# Patient Record
Sex: Male | Born: 1995 | Race: Black or African American | Hispanic: No | Marital: Single | Smoking: Never smoker
Health system: Southern US, Community
[De-identification: ages and names within clinical notes are randomized; demographics above are authoritative.]

## PROBLEM LIST (undated history)

## (undated) DIAGNOSIS — L732 Hidradenitis suppurativa: Secondary | ICD-10-CM

## (undated) HISTORY — PX: HYDRADENITIS EXCISION: SHX5243

---
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
## 2014-07-04 ENCOUNTER — Other Ambulatory Visit: Payer: Self-pay | Admitting: Otolaryngology

## 2014-07-04 DIAGNOSIS — D440 Neoplasm of uncertain behavior of thyroid gland: Secondary | ICD-10-CM

## 2014-07-04 DIAGNOSIS — E041 Nontoxic single thyroid nodule: Secondary | ICD-10-CM

## 2014-07-05 ENCOUNTER — Other Ambulatory Visit (HOSPITAL_COMMUNITY): Payer: Self-pay

## 2014-07-08 ENCOUNTER — Inpatient Hospital Stay (HOSPITAL_COMMUNITY): Admission: RE | Admit: 2014-07-08 | Payer: Self-pay | Source: Ambulatory Visit

## 2014-07-17 ENCOUNTER — Inpatient Hospital Stay: Admission: RE | Admit: 2014-07-17 | Payer: Self-pay | Source: Ambulatory Visit

## 2014-07-19 ENCOUNTER — Other Ambulatory Visit (HOSPITAL_COMMUNITY): Payer: Self-pay | Admitting: *Deleted

## 2014-07-22 ENCOUNTER — Encounter (HOSPITAL_COMMUNITY)
Admission: RE | Admit: 2014-07-22 | Discharge: 2014-07-22 | Disposition: A | Discharge status: Home / Self Care | Payer: BLUE CROSS/BLUE SHIELD | Source: Ambulatory Visit | Attending: Dermatology | Admitting: Dermatology

## 2014-07-22 DIAGNOSIS — L732 Hidradenitis suppurativa: Secondary | ICD-10-CM | POA: Diagnosis not present

## 2014-07-22 MED ORDER — SODIUM CHLORIDE 0.9 % IV SOLN
INTRAVENOUS | Status: DC
  Administered 2014-07-22: 250 mL via INTRAVENOUS

## 2014-07-22 MED ORDER — SODIUM CHLORIDE 0.9 % IV SOLN
5.0000 mg/kg | INTRAVENOUS | Status: DC
  Administered 2014-07-22: 300 mg via INTRAVENOUS
  Filled 2014-07-22: qty 30

## 2014-07-22 MED ORDER — DIPHENHYDRAMINE HCL 25 MG PO CAPS
50.0000 mg | ORAL_CAPSULE | ORAL | Status: DC
  Filled 2014-07-22: qty 2

## 2014-07-22 MED ORDER — ACETAMINOPHEN 325 MG PO TABS: 650.0000 mg | ORAL_TABLET | ORAL | Status: DC

## 2014-08-15 ENCOUNTER — Ambulatory Visit
Admission: RE | Admit: 2014-08-15 | Discharge: 2014-08-15 | Disposition: A | Discharge status: Home / Self Care | Payer: BLUE CROSS/BLUE SHIELD | Source: Ambulatory Visit | Attending: Otolaryngology | Admitting: Otolaryngology

## 2014-08-15 DIAGNOSIS — D440 Neoplasm of uncertain behavior of thyroid gland: Secondary | ICD-10-CM

## 2014-08-19 ENCOUNTER — Encounter (HOSPITAL_COMMUNITY)
Admission: RE | Admit: 2014-08-19 | Discharge: 2014-08-19 | Disposition: A | Discharge status: Home / Self Care | Payer: BLUE CROSS/BLUE SHIELD | Source: Ambulatory Visit | Attending: Dermatology | Admitting: Dermatology

## 2014-08-19 DIAGNOSIS — L732 Hidradenitis suppurativa: Secondary | ICD-10-CM | POA: Insufficient documentation

## 2014-08-19 MED ORDER — DIPHENHYDRAMINE HCL 25 MG PO CAPS: 50.0000 mg | ORAL_CAPSULE | ORAL | Status: DC

## 2014-08-19 MED ORDER — SODIUM CHLORIDE 0.9 % IV SOLN
5.0000 mg/kg | INTRAVENOUS | Status: DC
  Administered 2014-08-19: 300 mg via INTRAVENOUS
  Filled 2014-08-19: qty 30

## 2014-08-19 MED ORDER — SODIUM CHLORIDE 0.9 % IV SOLN
INTRAVENOUS | Status: DC
  Administered 2014-08-19: 10:00:00 via INTRAVENOUS

## 2014-08-19 MED ORDER — ACETAMINOPHEN 325 MG PO TABS: 650.0000 mg | ORAL_TABLET | ORAL | Status: DC

## 2014-08-20 ENCOUNTER — Ambulatory Visit
Admission: RE | Admit: 2014-08-20 | Discharge: 2014-08-20 | Disposition: A | Discharge status: Home / Self Care | Payer: BLUE CROSS/BLUE SHIELD | Source: Ambulatory Visit | Attending: Otolaryngology | Admitting: Otolaryngology

## 2014-08-20 ENCOUNTER — Other Ambulatory Visit: Payer: Self-pay | Admitting: Otolaryngology

## 2014-08-20 ENCOUNTER — Other Ambulatory Visit: Payer: BLUE CROSS/BLUE SHIELD

## 2014-08-20 ENCOUNTER — Other Ambulatory Visit (HOSPITAL_COMMUNITY)
Admission: RE | Admit: 2014-08-20 | Discharge: 2014-08-20 | Disposition: A | Discharge status: Home / Self Care | Payer: BLUE CROSS/BLUE SHIELD | Source: Ambulatory Visit | Attending: Interventional Radiology | Admitting: Interventional Radiology

## 2014-08-20 DIAGNOSIS — D440 Neoplasm of uncertain behavior of thyroid gland: Secondary | ICD-10-CM

## 2014-08-20 DIAGNOSIS — E041 Nontoxic single thyroid nodule: Secondary | ICD-10-CM | POA: Diagnosis not present

## 2014-09-16 ENCOUNTER — Ambulatory Visit (HOSPITAL_COMMUNITY)
Admission: RE | Admit: 2014-09-16 | Discharge: 2014-09-16 | Disposition: A | Discharge status: Home / Self Care | Payer: BLUE CROSS/BLUE SHIELD | Source: Ambulatory Visit | Attending: Dermatology | Admitting: Dermatology

## 2014-09-16 DIAGNOSIS — L732 Hidradenitis suppurativa: Secondary | ICD-10-CM | POA: Diagnosis not present

## 2014-09-16 MED ORDER — ACETAMINOPHEN 325 MG PO TABS: 650.0000 mg | ORAL_TABLET | Freq: Once | ORAL | Status: DC

## 2014-09-16 MED ORDER — SODIUM CHLORIDE 0.9 % IV SOLN
5.0000 mg/kg | Freq: Once | INTRAVENOUS | Status: AC
  Administered 2014-09-16: 300 mg via INTRAVENOUS
  Filled 2014-09-16: qty 30

## 2014-09-16 MED ORDER — DIPHENHYDRAMINE HCL 25 MG PO CAPS: 50.0000 mg | ORAL_CAPSULE | Freq: Once | ORAL | Status: DC

## 2014-09-16 MED ORDER — SODIUM CHLORIDE 0.9 % IV SOLN: Freq: Once | INTRAVENOUS | Status: DC

## 2014-10-14 ENCOUNTER — Encounter (HOSPITAL_COMMUNITY)
Admission: RE | Admit: 2014-10-14 | Discharge: 2014-10-14 | Disposition: A | Discharge status: Home / Self Care | Payer: BLUE CROSS/BLUE SHIELD | Source: Ambulatory Visit | Attending: Dermatology | Admitting: Dermatology

## 2014-10-14 DIAGNOSIS — L732 Hidradenitis suppurativa: Secondary | ICD-10-CM | POA: Insufficient documentation

## 2014-10-14 MED ORDER — DIPHENHYDRAMINE HCL 25 MG PO TABS
50.0000 mg | ORAL_TABLET | ORAL | Status: DC
  Filled 2014-10-14: qty 2

## 2014-10-14 MED ORDER — SODIUM CHLORIDE 0.9 % IV SOLN
INTRAVENOUS | Status: DC
  Administered 2014-10-14: 11:00:00 via INTRAVENOUS

## 2014-10-14 MED ORDER — ACETAMINOPHEN 325 MG PO TABS: 650.0000 mg | ORAL_TABLET | ORAL | Status: DC

## 2014-10-14 MED ORDER — SODIUM CHLORIDE 0.9 % IV SOLN
5.0000 mg/kg | INTRAVENOUS | Status: DC
  Administered 2014-10-14: 300 mg via INTRAVENOUS
  Filled 2014-10-14: qty 30

## 2014-11-12 ENCOUNTER — Ambulatory Visit (HOSPITAL_COMMUNITY)
Admission: RE | Admit: 2014-11-12 | Discharge: 2014-11-12 | Disposition: A | Discharge status: Home / Self Care | Payer: BLUE CROSS/BLUE SHIELD | Source: Ambulatory Visit | Attending: Dermatology | Admitting: Dermatology

## 2014-11-12 DIAGNOSIS — L732 Hidradenitis suppurativa: Secondary | ICD-10-CM | POA: Diagnosis not present

## 2014-11-12 MED ORDER — ACETAMINOPHEN 325 MG PO TABS: 650.0000 mg | ORAL_TABLET | ORAL | Status: DC

## 2014-11-12 MED ORDER — DIPHENHYDRAMINE HCL 25 MG PO CAPS: 50.0000 mg | ORAL_CAPSULE | ORAL | Status: DC

## 2014-11-12 MED ORDER — INFLIXIMAB 100 MG IV SOLR
5.0000 mg/kg | INTRAVENOUS | Status: DC
  Administered 2014-11-12: 300 mg via INTRAVENOUS
  Filled 2014-11-12: qty 30

## 2014-11-12 MED ORDER — SODIUM CHLORIDE 0.9 % IV SOLN
INTRAVENOUS | Status: DC
  Administered 2014-11-12: 10:00:00 via INTRAVENOUS

## 2014-12-09 ENCOUNTER — Ambulatory Visit (HOSPITAL_COMMUNITY)
Admission: RE | Admit: 2014-12-09 | Discharge: 2014-12-09 | Disposition: A | Discharge status: Home / Self Care | Payer: BLUE CROSS/BLUE SHIELD | Source: Ambulatory Visit | Attending: Dermatology | Admitting: Dermatology

## 2014-12-09 DIAGNOSIS — L732 Hidradenitis suppurativa: Secondary | ICD-10-CM | POA: Diagnosis not present

## 2014-12-09 MED ORDER — ACETAMINOPHEN 325 MG PO TABS: 650.0000 mg | ORAL_TABLET | ORAL | Status: DC

## 2014-12-09 MED ORDER — INFLIXIMAB 100 MG IV SOLR
5.0000 mg/kg | INTRAVENOUS | Status: DC
  Administered 2014-12-09: 300 mg via INTRAVENOUS
  Filled 2014-12-09: qty 30

## 2014-12-09 MED ORDER — DIPHENHYDRAMINE HCL 25 MG PO CAPS
50.0000 mg | ORAL_CAPSULE | ORAL | Status: DC
Start: 2014-12-10 — End: 2014-12-10

## 2014-12-09 MED ORDER — SODIUM CHLORIDE 0.9 % IV SOLN
INTRAVENOUS | Status: DC
Start: 2014-12-10 — End: 2014-12-10
  Administered 2014-12-09: 11:00:00 via INTRAVENOUS

## 2014-12-09 NOTE — Discharge Instructions (Signed)
Excuse from Work, [employer], or Physical Activity ______________[name]____________________________________ needs to be excused from: _____ Work __X___ [employer] _____ Physical activity Beginning now and through the following date: ___10/3/16_________________ _____ He/she may return to work or school but still avoid physical activity from now until: ____________________ __X___ He/she may return to full physical activity as of: 10/3/16____________________ Caregiver's signature: _____Kristin [place] [phone]___________________________________  Date: _____10/3/2016_________________________________________________ Document Released: 08/18/2000 Document Revised: 05/17/2011 Document Reviewed: 02/22/2005 ExitCare Patient Information 2015 [place]. This information is not intended to replace advice given to you by your health care provider. Make sure you discuss any questions you have with your health care provider.

## 2014-12-10 DIAGNOSIS — L732 Hidradenitis suppurativa: Secondary | ICD-10-CM | POA: Diagnosis not present

## 2014-12-10 MED FILL — Sodium Chloride IV Soln 0.9%: INTRAVENOUS | Qty: 250 | Status: AC

## 2015-01-07 ENCOUNTER — Ambulatory Visit (HOSPITAL_COMMUNITY)
Admission: RE | Admit: 2015-01-07 | Discharge: 2015-01-07 | Disposition: A | Discharge status: Home / Self Care | Payer: BLUE CROSS/BLUE SHIELD | Source: Ambulatory Visit | Attending: Dermatology | Admitting: Dermatology

## 2015-01-07 DIAGNOSIS — L732 Hidradenitis suppurativa: Secondary | ICD-10-CM | POA: Diagnosis not present

## 2015-01-07 MED ORDER — INFLIXIMAB 100 MG IV SOLR
5.0000 mg/kg | INTRAVENOUS | Status: DC
  Administered 2015-01-07: 300 mg via INTRAVENOUS
  Filled 2015-01-07: qty 30

## 2015-01-07 MED ORDER — ACETAMINOPHEN 325 MG PO TABS: 650.0000 mg | ORAL_TABLET | Freq: Four times a day (QID) | ORAL | Status: DC | PRN

## 2015-01-07 MED ORDER — DIPHENHYDRAMINE HCL 25 MG PO CAPS: 50.0000 mg | ORAL_CAPSULE | Freq: Once | ORAL | Status: DC

## 2015-01-07 MED ORDER — SODIUM CHLORIDE 0.9 % IV SOLN
INTRAVENOUS | Status: DC
  Administered 2015-01-07: 11:00:00 via INTRAVENOUS

## 2015-02-04 ENCOUNTER — Encounter (HOSPITAL_COMMUNITY)
Admission: RE | Admit: 2015-02-04 | Discharge: 2015-02-04 | Disposition: A | Discharge status: Home / Self Care | Payer: BLUE CROSS/BLUE SHIELD | Source: Ambulatory Visit | Attending: Dermatology | Admitting: Dermatology

## 2015-02-04 DIAGNOSIS — L732 Hidradenitis suppurativa: Secondary | ICD-10-CM | POA: Insufficient documentation

## 2015-02-04 MED ORDER — SODIUM CHLORIDE 0.9 % IV SOLN
5.0000 mg/kg | INTRAVENOUS | Status: DC
  Administered 2015-02-04: 300 mg via INTRAVENOUS
  Filled 2015-02-04: qty 30

## 2015-02-04 MED ORDER — DIPHENHYDRAMINE HCL 25 MG PO CAPS: 50.0000 mg | ORAL_CAPSULE | ORAL | Status: DC

## 2015-02-04 MED ORDER — SODIUM CHLORIDE 0.9 % IV SOLN
INTRAVENOUS | Status: DC
  Administered 2015-02-04: 11:00:00 via INTRAVENOUS

## 2015-02-04 MED ORDER — ACETAMINOPHEN 325 MG PO TABS: 650.0000 mg | ORAL_TABLET | ORAL | Status: DC

## 2015-03-04 ENCOUNTER — Encounter (HOSPITAL_COMMUNITY)
Admission: RE | Admit: 2015-03-04 | Discharge: 2015-03-04 | Disposition: A | Discharge status: Home / Self Care | Payer: BLUE CROSS/BLUE SHIELD | Source: Ambulatory Visit | Attending: Dermatology | Admitting: Dermatology

## 2015-03-04 DIAGNOSIS — L732 Hidradenitis suppurativa: Secondary | ICD-10-CM | POA: Diagnosis not present

## 2015-03-04 MED ORDER — SODIUM CHLORIDE 0.9 % IV SOLN
INTRAVENOUS | Status: DC
  Administered 2015-03-04: 11:00:00 via INTRAVENOUS

## 2015-03-04 MED ORDER — INFLIXIMAB 100 MG IV SOLR
5.0000 mg/kg | INTRAVENOUS | Status: DC
  Administered 2015-03-04: 300 mg via INTRAVENOUS
  Filled 2015-03-04: qty 30

## 2015-03-04 MED ORDER — DIPHENHYDRAMINE HCL 25 MG PO CAPS: 50.0000 mg | ORAL_CAPSULE | ORAL | Status: DC

## 2015-03-04 MED ORDER — ACETAMINOPHEN 325 MG PO TABS: 650.0000 mg | ORAL_TABLET | ORAL | Status: DC

## 2015-03-31 ENCOUNTER — Other Ambulatory Visit (HOSPITAL_COMMUNITY): Payer: Self-pay | Admitting: *Deleted

## 2015-04-01 ENCOUNTER — Encounter (HOSPITAL_COMMUNITY)
Admission: RE | Admit: 2015-04-01 | Discharge: 2015-04-01 | Disposition: A | Discharge status: Home / Self Care | Payer: BLUE CROSS/BLUE SHIELD | Source: Ambulatory Visit | Attending: Dermatology | Admitting: Dermatology

## 2015-04-01 DIAGNOSIS — L732 Hidradenitis suppurativa: Secondary | ICD-10-CM | POA: Insufficient documentation

## 2015-04-01 MED ORDER — ACETAMINOPHEN 325 MG PO TABS: 650.0000 mg | ORAL_TABLET | ORAL | Status: DC

## 2015-04-01 MED ORDER — SODIUM CHLORIDE 0.9 % IV SOLN
INTRAVENOUS | Status: DC
  Administered 2015-04-01: 10:00:00 via INTRAVENOUS

## 2015-04-01 MED ORDER — DIPHENHYDRAMINE HCL 25 MG PO CAPS: 50.0000 mg | ORAL_CAPSULE | ORAL | Status: DC

## 2015-04-01 MED ORDER — SODIUM CHLORIDE 0.9 % IV SOLN
5.0000 mg/kg | INTRAVENOUS | Status: DC
  Administered 2015-04-01: 300 mg via INTRAVENOUS
  Filled 2015-04-01: qty 30

## 2015-04-01 NOTE — Discharge Instructions (Signed)
°  Excuse from Work, [employer], or Physical Activity _[name]_________________________________________________ needs to be excused from: _____ Work __X___ [employer] _____ Physical activity beginning now and through the following date: 1/24/2017____________________. _X____ He or she may return to work or school but should still avoid the following physical activity or activities from now until ___N/A_________________. Activity restrictions include: _____ Lifting more than _______ lb _____ Sitting longer than __________ minutes at a time _____ Standing longer than ________ minutes at a time _____ He or she may return to full physical activity as of ___1/24/2017_________________. Health Care Provider Name (printed): Dorothea Glassman RN ________________________________________  [facility] Provider (signature): ___________________________________________ Date: __1/24/2017______________   This information is not intended to replace advice given to you by your health care provider. Make sure you discuss any questions you have with your health care provider.   Document Released: 08/18/2000 Document Revised: 03/15/2014 Document Reviewed: 09/24/2013 Elsevier Interactive Patient Education [employer].

## 2015-04-29 ENCOUNTER — Ambulatory Visit (HOSPITAL_COMMUNITY): Payer: BLUE CROSS/BLUE SHIELD

## 2015-05-05 ENCOUNTER — Other Ambulatory Visit (HOSPITAL_COMMUNITY): Payer: Self-pay | Admitting: *Deleted

## 2015-05-06 ENCOUNTER — Ambulatory Visit (HOSPITAL_COMMUNITY)
Admission: RE | Admit: 2015-05-06 | Discharge: 2015-05-06 | Disposition: A | Discharge status: Home / Self Care | Payer: Self-pay | Source: Ambulatory Visit | Attending: Dermatology | Admitting: Dermatology

## 2015-05-06 DIAGNOSIS — L732 Hidradenitis suppurativa: Secondary | ICD-10-CM | POA: Insufficient documentation

## 2015-05-06 MED ORDER — SODIUM CHLORIDE 0.9 % IV SOLN
INTRAVENOUS | Status: AC
  Administered 2015-05-06: 11:00:00 via INTRAVENOUS

## 2015-05-06 MED ORDER — ACETAMINOPHEN 325 MG PO TABS
650.0000 mg | ORAL_TABLET | ORAL | Status: DC
Start: 2015-05-06 — End: 2015-05-07

## 2015-05-06 MED ORDER — DIPHENHYDRAMINE HCL 25 MG PO CAPS: 50.0000 mg | ORAL_CAPSULE | ORAL | Status: DC

## 2015-05-06 MED ORDER — SODIUM CHLORIDE 0.9 % IV SOLN
5.0000 mg/kg | INTRAVENOUS | Status: AC
  Administered 2015-05-06: 300 mg via INTRAVENOUS
  Filled 2015-05-06: qty 30

## 2015-06-03 ENCOUNTER — Ambulatory Visit (HOSPITAL_COMMUNITY)
Admission: RE | Admit: 2015-06-03 | Discharge: 2015-06-03 | Disposition: A | Discharge status: Home / Self Care | Payer: Self-pay | Source: Ambulatory Visit | Attending: Dermatology | Admitting: Dermatology

## 2015-06-03 DIAGNOSIS — L732 Hidradenitis suppurativa: Secondary | ICD-10-CM | POA: Insufficient documentation

## 2015-06-03 DIAGNOSIS — Z79899 Other long term (current) drug therapy: Secondary | ICD-10-CM | POA: Insufficient documentation

## 2015-06-03 MED ORDER — DIPHENHYDRAMINE HCL 25 MG PO CAPS: 50.0000 mg | ORAL_CAPSULE | ORAL | Status: DC

## 2015-06-03 MED ORDER — SODIUM CHLORIDE 0.9 % IV SOLN
5.0000 mg/kg | INTRAVENOUS | Status: DC
  Administered 2015-06-03: 300 mg via INTRAVENOUS
  Filled 2015-06-03: qty 30

## 2015-06-03 MED ORDER — SODIUM CHLORIDE 0.9 % IV SOLN
INTRAVENOUS | Status: DC
  Administered 2015-06-03: 13:00:00 via INTRAVENOUS

## 2015-06-03 MED ORDER — ACETAMINOPHEN 325 MG PO TABS: 650.0000 mg | ORAL_TABLET | ORAL | Status: DC

## 2015-07-01 ENCOUNTER — Inpatient Hospital Stay (HOSPITAL_COMMUNITY): Admission: RE | Admit: 2015-07-01 | Payer: BLUE CROSS/BLUE SHIELD | Source: Ambulatory Visit

## 2015-07-01 ENCOUNTER — Other Ambulatory Visit (HOSPITAL_COMMUNITY): Payer: Self-pay | Admitting: *Deleted

## 2015-07-02 ENCOUNTER — Encounter (HOSPITAL_COMMUNITY)
Admission: RE | Admit: 2015-07-02 | Discharge: 2015-07-02 | Disposition: A | Discharge status: Home / Self Care | Payer: BLUE CROSS/BLUE SHIELD | Source: Ambulatory Visit | Attending: Dermatology | Admitting: Dermatology

## 2015-07-02 DIAGNOSIS — L732 Hidradenitis suppurativa: Secondary | ICD-10-CM | POA: Insufficient documentation

## 2015-07-02 MED ORDER — SODIUM CHLORIDE 0.9 % IV SOLN
INTRAVENOUS | Status: DC
  Administered 2015-07-02: 11:00:00 via INTRAVENOUS

## 2015-07-02 MED ORDER — DIPHENHYDRAMINE HCL 25 MG PO CAPS: 50.0000 mg | ORAL_CAPSULE | ORAL | Status: DC

## 2015-07-02 MED ORDER — ACETAMINOPHEN 325 MG PO TABS: 650.0000 mg | ORAL_TABLET | ORAL | Status: DC

## 2015-07-02 MED ORDER — INFLIXIMAB 100 MG IV SOLR
5.0000 mg/kg | INTRAVENOUS | Status: DC
  Administered 2015-07-02: 300 mg via INTRAVENOUS
  Filled 2015-07-02: qty 30

## 2015-07-28 ENCOUNTER — Other Ambulatory Visit (HOSPITAL_COMMUNITY): Payer: Self-pay | Admitting: *Deleted

## 2015-07-29 ENCOUNTER — Ambulatory Visit (HOSPITAL_COMMUNITY)
Admission: RE | Admit: 2015-07-29 | Discharge: 2015-07-29 | Disposition: A | Discharge status: Home / Self Care | Payer: BLUE CROSS/BLUE SHIELD | Source: Ambulatory Visit | Attending: Dermatology | Admitting: Dermatology

## 2015-07-29 DIAGNOSIS — L732 Hidradenitis suppurativa: Secondary | ICD-10-CM | POA: Insufficient documentation

## 2015-07-29 MED ORDER — SODIUM CHLORIDE 0.9 % IV SOLN
5.0000 mg/kg | INTRAVENOUS | Status: DC
  Administered 2015-07-29: 300 mg via INTRAVENOUS
  Filled 2015-07-29: qty 30

## 2015-07-29 MED ORDER — DIPHENHYDRAMINE HCL 25 MG PO CAPS: 50.0000 mg | ORAL_CAPSULE | ORAL | Status: DC

## 2015-07-29 MED ORDER — SODIUM CHLORIDE 0.9 % IV SOLN
INTRAVENOUS | Status: DC
  Administered 2015-07-29: 12:00:00 via INTRAVENOUS

## 2015-07-29 MED ORDER — ACETAMINOPHEN 325 MG PO TABS: 650.0000 mg | ORAL_TABLET | ORAL | Status: DC

## 2015-07-29 MED ORDER — SODIUM CHLORIDE 0.9 % IV SOLN
5.0000 mg/kg | INTRAVENOUS | Status: DC
  Filled 2015-07-29: qty 30

## 2015-08-26 ENCOUNTER — Ambulatory Visit (HOSPITAL_COMMUNITY)
Admission: RE | Admit: 2015-08-26 | Discharge: 2015-08-26 | Disposition: A | Discharge status: Home / Self Care | Payer: BLUE CROSS/BLUE SHIELD | Source: Ambulatory Visit | Attending: Dermatology | Admitting: Dermatology

## 2015-08-26 DIAGNOSIS — L732 Hidradenitis suppurativa: Secondary | ICD-10-CM | POA: Insufficient documentation

## 2015-08-26 MED ORDER — DIPHENHYDRAMINE HCL 25 MG PO CAPS: 50.0000 mg | ORAL_CAPSULE | ORAL | Status: DC

## 2015-08-26 MED ORDER — ACETAMINOPHEN 325 MG PO TABS: 650.0000 mg | ORAL_TABLET | ORAL | Status: DC

## 2015-08-26 MED ORDER — SODIUM CHLORIDE 0.9 % IV SOLN
INTRAVENOUS | Status: DC
  Administered 2015-08-26: 11:00:00 via INTRAVENOUS

## 2015-08-26 MED ORDER — SODIUM CHLORIDE 0.9 % IV SOLN
5.0000 mg/kg | INTRAVENOUS | Status: DC
  Administered 2015-08-26: 300 mg via INTRAVENOUS
  Filled 2015-08-26: qty 30

## 2015-09-23 ENCOUNTER — Other Ambulatory Visit (HOSPITAL_COMMUNITY): Payer: Self-pay | Admitting: *Deleted

## 2015-09-23 ENCOUNTER — Ambulatory Visit (HOSPITAL_COMMUNITY): Admission: RE | Admit: 2015-09-23 | Payer: BLUE CROSS/BLUE SHIELD | Source: Ambulatory Visit

## 2015-09-24 ENCOUNTER — Encounter (HOSPITAL_COMMUNITY)
Admission: RE | Admit: 2015-09-24 | Discharge: 2015-09-24 | Disposition: A | Discharge status: Home / Self Care | Payer: Self-pay | Source: Ambulatory Visit | Attending: Dermatology | Admitting: Dermatology

## 2015-09-24 DIAGNOSIS — L732 Hidradenitis suppurativa: Secondary | ICD-10-CM | POA: Insufficient documentation

## 2015-09-24 MED ORDER — DIPHENHYDRAMINE HCL 25 MG PO CAPS: 50.0000 mg | ORAL_CAPSULE | ORAL | Status: DC

## 2015-09-24 MED ORDER — SODIUM CHLORIDE 0.9 % IV SOLN
INTRAVENOUS | Status: AC
  Administered 2015-09-24: 11:00:00 via INTRAVENOUS

## 2015-09-24 MED ORDER — SODIUM CHLORIDE 0.9 % IV SOLN
5.0000 mg/kg | INTRAVENOUS | Status: AC
  Administered 2015-09-24: 300 mg via INTRAVENOUS
  Filled 2015-09-24: qty 30

## 2015-09-24 MED ORDER — ACETAMINOPHEN 325 MG PO TABS: 650.0000 mg | ORAL_TABLET | ORAL | Status: DC

## 2015-10-22 ENCOUNTER — Ambulatory Visit (HOSPITAL_COMMUNITY)
Admission: RE | Admit: 2015-10-22 | Discharge: 2015-10-22 | Disposition: A | Discharge status: Home / Self Care | Payer: BLUE CROSS/BLUE SHIELD | Source: Ambulatory Visit | Attending: Dermatology | Admitting: Dermatology

## 2015-10-22 DIAGNOSIS — L732 Hidradenitis suppurativa: Secondary | ICD-10-CM | POA: Insufficient documentation

## 2015-10-22 MED ORDER — ACETAMINOPHEN 325 MG PO TABS: 650.0000 mg | ORAL_TABLET | ORAL | Status: DC

## 2015-10-22 MED ORDER — DIPHENHYDRAMINE HCL 25 MG PO CAPS: 50.0000 mg | ORAL_CAPSULE | ORAL | Status: DC

## 2015-10-22 MED ORDER — SODIUM CHLORIDE 0.9 % IV SOLN
5.0000 mg/kg | INTRAVENOUS | Status: AC
  Administered 2015-10-22: 300 mg via INTRAVENOUS
  Filled 2015-10-22: qty 30

## 2015-10-22 MED ORDER — SODIUM CHLORIDE 0.9 % IV SOLN
INTRAVENOUS | Status: AC
  Administered 2015-10-22: 12:00:00 via INTRAVENOUS

## 2015-11-18 ENCOUNTER — Other Ambulatory Visit (HOSPITAL_COMMUNITY): Payer: Self-pay | Admitting: *Deleted

## 2015-11-19 ENCOUNTER — Ambulatory Visit (HOSPITAL_COMMUNITY): Admission: RE | Admit: 2015-11-19 | Payer: BLUE CROSS/BLUE SHIELD | Source: Ambulatory Visit

## 2015-11-19 ENCOUNTER — Ambulatory Visit (HOSPITAL_COMMUNITY)
Admission: RE | Admit: 2015-11-19 | Discharge: 2015-11-19 | Disposition: A | Discharge status: Home / Self Care | Payer: BLUE CROSS/BLUE SHIELD | Source: Ambulatory Visit | Attending: Dermatology | Admitting: Dermatology

## 2015-11-19 DIAGNOSIS — L732 Hidradenitis suppurativa: Secondary | ICD-10-CM | POA: Insufficient documentation

## 2015-11-19 MED ORDER — DIPHENHYDRAMINE HCL 25 MG PO TABS
50.0000 mg | ORAL_TABLET | Freq: Once | ORAL | Status: DC
  Filled 2015-11-19: qty 2

## 2015-11-19 MED ORDER — SODIUM CHLORIDE 0.9 % IV SOLN
INTRAVENOUS | Status: DC
  Administered 2015-11-19: 12:00:00 via INTRAVENOUS

## 2015-11-19 MED ORDER — ACETAMINOPHEN 325 MG PO TABS: 650.0000 mg | ORAL_TABLET | ORAL | Status: DC

## 2015-11-19 MED ORDER — SODIUM CHLORIDE 0.9 % IV SOLN
5.0000 mg/kg | INTRAVENOUS | Status: DC
  Administered 2015-11-19: 300 mg via INTRAVENOUS
  Filled 2015-11-19: qty 30

## 2015-11-19 NOTE — Progress Notes (Signed)
Pt tolerated remicade infusion well.  VSS at DC, pt dc home without complaint

## 2015-11-27 ENCOUNTER — Ambulatory Visit (HOSPITAL_COMMUNITY): Payer: BLUE CROSS/BLUE SHIELD

## 2015-12-17 ENCOUNTER — Other Ambulatory Visit (HOSPITAL_COMMUNITY): Payer: Self-pay | Admitting: *Deleted

## 2015-12-18 ENCOUNTER — Ambulatory Visit (HOSPITAL_COMMUNITY)
Admission: RE | Admit: 2015-12-18 | Discharge: 2015-12-18 | Disposition: A | Discharge status: Home / Self Care | Payer: Self-pay | Source: Ambulatory Visit | Attending: Dermatology | Admitting: Dermatology

## 2015-12-18 DIAGNOSIS — L732 Hidradenitis suppurativa: Secondary | ICD-10-CM | POA: Insufficient documentation

## 2015-12-18 MED ORDER — ACETAMINOPHEN 325 MG PO TABS: 650.0000 mg | ORAL_TABLET | ORAL | Status: DC

## 2015-12-18 MED ORDER — SODIUM CHLORIDE 0.9 % IV SOLN
INTRAVENOUS | Status: DC
  Administered 2015-12-18: 12:00:00 via INTRAVENOUS

## 2015-12-18 MED ORDER — SODIUM CHLORIDE 0.9 % IV SOLN
5.0000 mg/kg | INTRAVENOUS | Status: DC
  Filled 2015-12-18: qty 30

## 2015-12-18 MED ORDER — SODIUM CHLORIDE 0.9 % IV SOLN
5.0000 mg/kg | INTRAVENOUS | Status: DC
  Administered 2015-12-18: 300 mg via INTRAVENOUS
  Filled 2015-12-18: qty 30

## 2015-12-18 MED ORDER — DIPHENHYDRAMINE HCL 25 MG PO CAPS: 50.0000 mg | ORAL_CAPSULE | ORAL | Status: DC

## 2016-01-15 ENCOUNTER — Ambulatory Visit (HOSPITAL_COMMUNITY)
Admission: RE | Admit: 2016-01-15 | Discharge: 2016-01-15 | Disposition: A | Discharge status: Home / Self Care | Payer: Self-pay | Source: Ambulatory Visit | Attending: Dermatology | Admitting: Dermatology

## 2016-01-15 DIAGNOSIS — L732 Hidradenitis suppurativa: Secondary | ICD-10-CM | POA: Insufficient documentation

## 2016-01-15 MED ORDER — SODIUM CHLORIDE 0.9 % IV SOLN
5.0000 mg/kg | INTRAVENOUS | Status: AC
  Administered 2016-01-15: 300 mg via INTRAVENOUS
  Filled 2016-01-15: qty 30

## 2016-01-15 MED ORDER — SODIUM CHLORIDE 0.9 % IV SOLN
INTRAVENOUS | Status: AC
  Administered 2016-01-15: 12:00:00 via INTRAVENOUS

## 2016-01-15 MED ORDER — DIPHENHYDRAMINE HCL 25 MG PO CAPS: 50.0000 mg | ORAL_CAPSULE | ORAL | Status: DC

## 2016-01-15 MED ORDER — ACETAMINOPHEN 325 MG PO TABS: 650.0000 mg | ORAL_TABLET | ORAL | Status: DC

## 2016-02-11 ENCOUNTER — Other Ambulatory Visit (HOSPITAL_COMMUNITY): Payer: Self-pay | Admitting: *Deleted

## 2016-02-12 ENCOUNTER — Ambulatory Visit (HOSPITAL_COMMUNITY)
Admission: RE | Admit: 2016-02-12 | Discharge: 2016-02-12 | Disposition: A | Discharge status: Home / Self Care | Payer: Self-pay | Source: Ambulatory Visit | Attending: Dermatology | Admitting: Dermatology

## 2016-02-12 DIAGNOSIS — L732 Hidradenitis suppurativa: Secondary | ICD-10-CM | POA: Insufficient documentation

## 2016-02-12 MED ORDER — ACETAMINOPHEN 325 MG PO TABS: 650.0000 mg | ORAL_TABLET | ORAL | Status: DC

## 2016-02-12 MED ORDER — SODIUM CHLORIDE 0.9 % IV SOLN
7.5000 mg/kg | INTRAVENOUS | Status: DC
  Administered 2016-02-12: 400 mg via INTRAVENOUS
  Filled 2016-02-12: qty 10

## 2016-02-12 MED ORDER — SODIUM CHLORIDE 0.9 % IV SOLN: INTRAVENOUS | Status: DC

## 2016-02-12 MED ORDER — DIPHENHYDRAMINE HCL 25 MG PO CAPS: 50.0000 mg | ORAL_CAPSULE | ORAL | Status: DC

## 2016-03-11 ENCOUNTER — Ambulatory Visit (HOSPITAL_COMMUNITY)
Admission: RE | Admit: 2016-03-11 | Discharge: 2016-03-11 | Disposition: A | Discharge status: Home / Self Care | Payer: Self-pay | Source: Ambulatory Visit | Attending: Dermatology | Admitting: Dermatology

## 2016-03-11 DIAGNOSIS — L732 Hidradenitis suppurativa: Secondary | ICD-10-CM | POA: Insufficient documentation

## 2016-03-11 MED ORDER — SODIUM CHLORIDE 0.9 % IV SOLN
7.5000 mg/kg | INTRAVENOUS | Status: DC
  Administered 2016-03-11: 400 mg via INTRAVENOUS
  Filled 2016-03-11: qty 40

## 2016-03-11 MED ORDER — DIPHENHYDRAMINE HCL 25 MG PO CAPS: 50.0000 mg | ORAL_CAPSULE | ORAL | Status: DC

## 2016-03-11 MED ORDER — SODIUM CHLORIDE 0.9 % IV SOLN
INTRAVENOUS | Status: DC
  Administered 2016-03-11: 12:00:00 via INTRAVENOUS

## 2016-03-11 MED ORDER — ACETAMINOPHEN 325 MG PO TABS: 650.0000 mg | ORAL_TABLET | ORAL | Status: DC

## 2016-04-07 ENCOUNTER — Other Ambulatory Visit (HOSPITAL_COMMUNITY): Payer: Self-pay | Admitting: *Deleted

## 2016-04-08 ENCOUNTER — Inpatient Hospital Stay (HOSPITAL_COMMUNITY): Admission: RE | Admit: 2016-04-08 | Payer: Self-pay | Source: Ambulatory Visit

## 2016-04-09 ENCOUNTER — Other Ambulatory Visit (HOSPITAL_COMMUNITY): Payer: Self-pay | Admitting: *Deleted

## 2016-04-12 ENCOUNTER — Ambulatory Visit (HOSPITAL_COMMUNITY)
Admission: RE | Admit: 2016-04-12 | Discharge: 2016-04-12 | Disposition: A | Discharge status: Home / Self Care | Payer: Self-pay | Source: Ambulatory Visit | Attending: Dermatology | Admitting: Dermatology

## 2016-04-12 DIAGNOSIS — L732 Hidradenitis suppurativa: Secondary | ICD-10-CM | POA: Insufficient documentation

## 2016-04-12 MED ORDER — SODIUM CHLORIDE 0.9 % IV SOLN
7.5000 mg/kg | INTRAVENOUS | Status: DC
  Administered 2016-04-12: 400 mg via INTRAVENOUS
  Filled 2016-04-12: qty 40

## 2016-04-12 MED ORDER — ACETAMINOPHEN 325 MG PO TABS: 650.0000 mg | ORAL_TABLET | ORAL | Status: DC

## 2016-04-12 MED ORDER — SODIUM CHLORIDE 0.9 % IV SOLN
INTRAVENOUS | Status: DC
  Administered 2016-04-12: 11:00:00 via INTRAVENOUS

## 2016-04-12 MED ORDER — DIPHENHYDRAMINE HCL 25 MG PO CAPS: 50.0000 mg | ORAL_CAPSULE | ORAL | Status: DC

## 2016-05-10 ENCOUNTER — Ambulatory Visit (HOSPITAL_COMMUNITY)
Admission: RE | Admit: 2016-05-10 | Discharge: 2016-05-10 | Disposition: A | Discharge status: Home / Self Care | Payer: Self-pay | Source: Ambulatory Visit | Attending: Dermatology | Admitting: Dermatology

## 2016-05-10 NOTE — Progress Notes (Signed)
Pt called at 11:25am (scheduled for 11:00am) and said that he was 10 minutes away and that his GPS got him lost.  At 11:45am, he was still not in [facility].  I called and asked if he was in the building yet. He stated that he was not.  We informed him that at this point he was 45 minutes past his scheduled appointment time and would have to reschedule.  He verbalized understanding

## 2016-05-11 ENCOUNTER — Other Ambulatory Visit (HOSPITAL_COMMUNITY): Payer: Self-pay | Admitting: *Deleted

## 2016-05-12 ENCOUNTER — Encounter (HOSPITAL_COMMUNITY)
Admission: RE | Admit: 2016-05-12 | Discharge: 2016-05-12 | Disposition: A | Discharge status: Home / Self Care | Payer: Managed Care, Other (non HMO) | Source: Ambulatory Visit | Attending: Dermatology | Admitting: Dermatology

## 2016-05-12 DIAGNOSIS — L732 Hidradenitis suppurativa: Secondary | ICD-10-CM | POA: Insufficient documentation

## 2016-05-12 MED ORDER — INFLIXIMAB 100 MG IV SOLR
7.5000 mg/kg | Freq: Once | INTRAVENOUS | Status: AC
  Administered 2016-05-12: 400 mg via INTRAVENOUS
  Filled 2016-05-12: qty 40

## 2016-05-12 MED ORDER — DIPHENHYDRAMINE HCL 25 MG PO CAPS: 50.0000 mg | ORAL_CAPSULE | Freq: Once | ORAL | Status: DC

## 2016-05-12 MED ORDER — SODIUM CHLORIDE 0.9 % IV SOLN
Freq: Once | INTRAVENOUS | Status: AC
  Administered 2016-05-12: 11:00:00 via INTRAVENOUS

## 2016-05-12 MED ORDER — ACETAMINOPHEN 325 MG PO TABS: 650.0000 mg | ORAL_TABLET | Freq: Once | ORAL | Status: DC

## 2016-06-04 ENCOUNTER — Other Ambulatory Visit (HOSPITAL_COMMUNITY): Payer: Self-pay | Admitting: *Deleted

## 2016-06-07 ENCOUNTER — Encounter (HOSPITAL_COMMUNITY)
Admission: RE | Admit: 2016-06-07 | Discharge: 2016-06-07 | Disposition: A | Discharge status: Home / Self Care | Payer: Managed Care, Other (non HMO) | Source: Ambulatory Visit | Attending: Dermatology | Admitting: Dermatology

## 2016-06-07 DIAGNOSIS — L732 Hidradenitis suppurativa: Secondary | ICD-10-CM | POA: Insufficient documentation

## 2016-06-09 ENCOUNTER — Encounter (HOSPITAL_COMMUNITY)
Admission: RE | Admit: 2016-06-09 | Discharge: 2016-06-09 | Disposition: A | Discharge status: Home / Self Care | Payer: Managed Care, Other (non HMO) | Source: Ambulatory Visit | Attending: Dermatology | Admitting: Dermatology

## 2016-06-09 ENCOUNTER — Encounter (HOSPITAL_COMMUNITY): Payer: Managed Care, Other (non HMO)

## 2016-06-09 DIAGNOSIS — L732 Hidradenitis suppurativa: Secondary | ICD-10-CM | POA: Diagnosis not present

## 2016-06-09 MED ORDER — DIPHENHYDRAMINE HCL 25 MG PO CAPS: 50.0000 mg | ORAL_CAPSULE | ORAL | Status: DC

## 2016-06-09 MED ORDER — SODIUM CHLORIDE 0.9 % IV SOLN
INTRAVENOUS | Status: DC
  Administered 2016-06-09: 09:00:00 via INTRAVENOUS

## 2016-06-09 MED ORDER — ACETAMINOPHEN 325 MG PO TABS: 650.0000 mg | ORAL_TABLET | ORAL | Status: DC

## 2016-06-09 MED ORDER — SODIUM CHLORIDE 0.9 % IV SOLN
7.5000 mg/kg | INTRAVENOUS | Status: DC
  Administered 2016-06-09: 400 mg via INTRAVENOUS
  Filled 2016-06-09: qty 40

## 2016-06-18 IMAGING — US US SOFT TISSUE HEAD/NECK
1 series · 13 of 25 positions shown · non-contrast
Comparison: None.

CLINICAL DATA: Thyroid nodule

EXAM:
THYROID ULTRASOUND
TECHNIQUE: Ultrasound examination of the thyroid gland and adjacent soft
tissues was performed.

[Series 1: us soft tissue head/neck · 0.09mm/px · 13 of 64 slices shown]
[im 1/64]
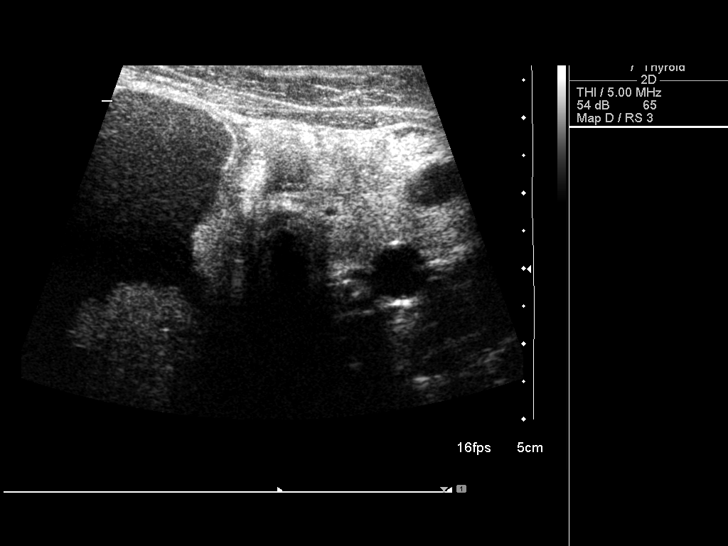
[im 6/64]
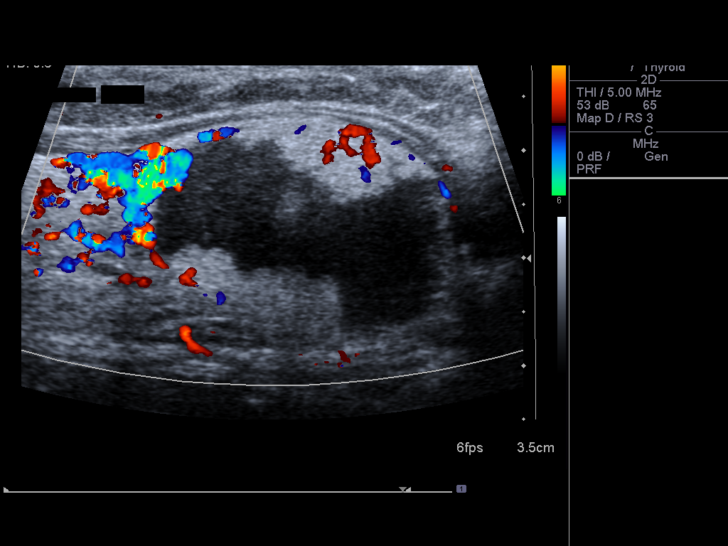
[im 11/64]
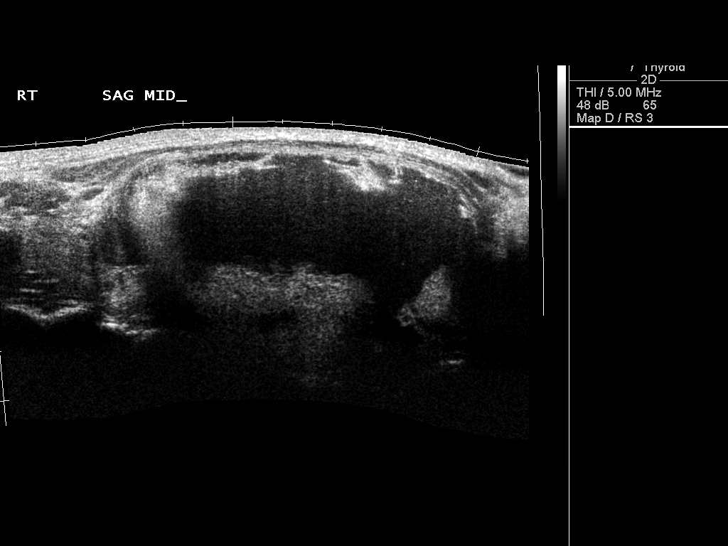
[im 16/64]
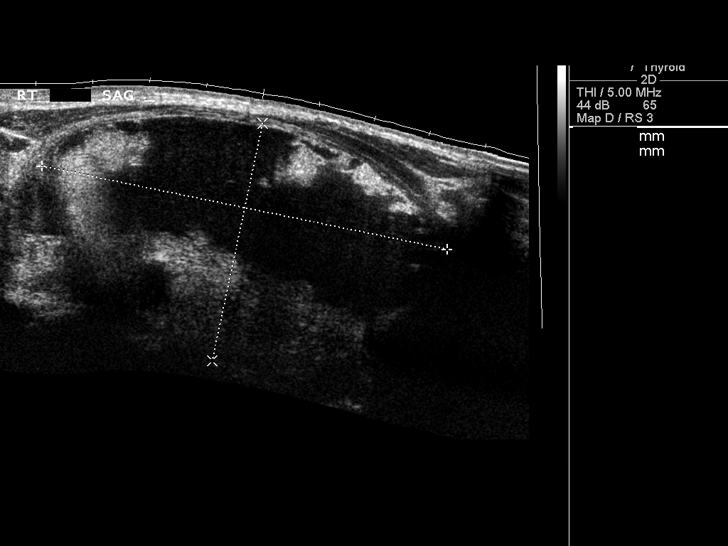
[im 22/64]
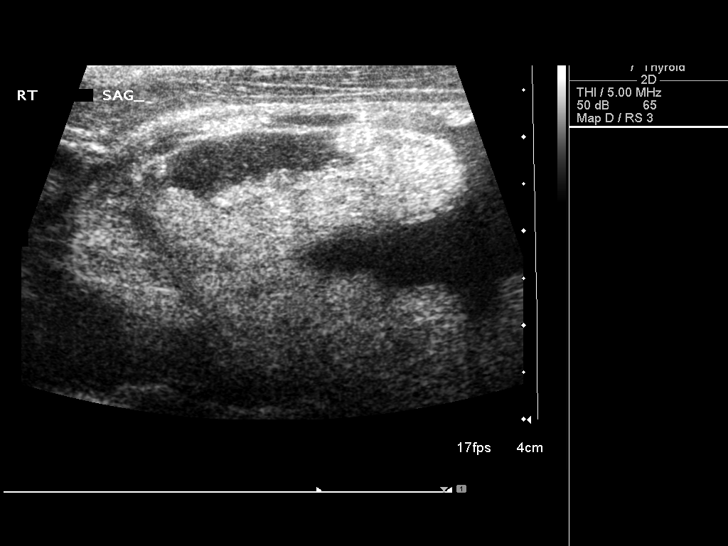
[im 27/64]
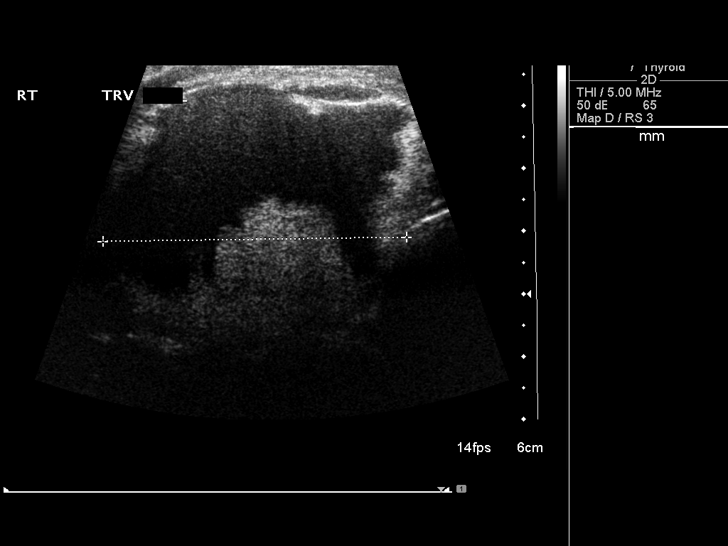
[im 32/64]
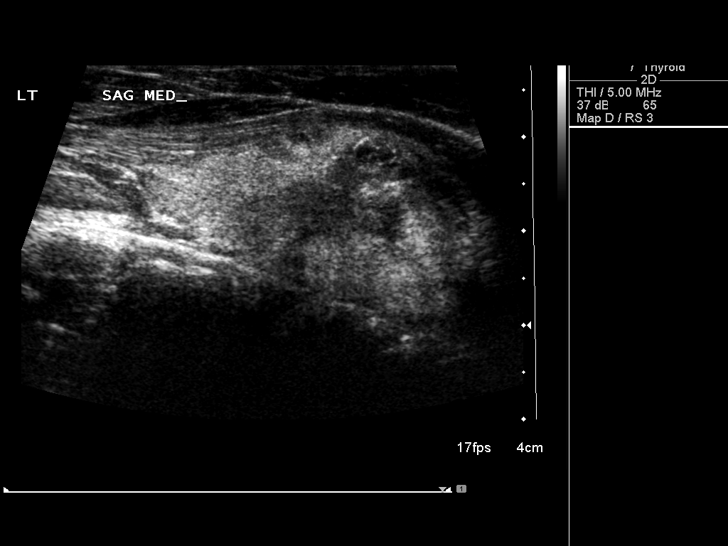
[im 37/64]
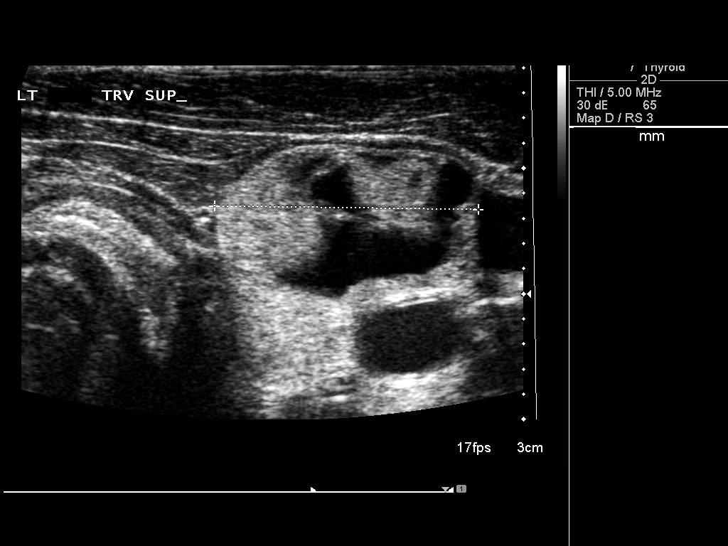
[im 43/64]
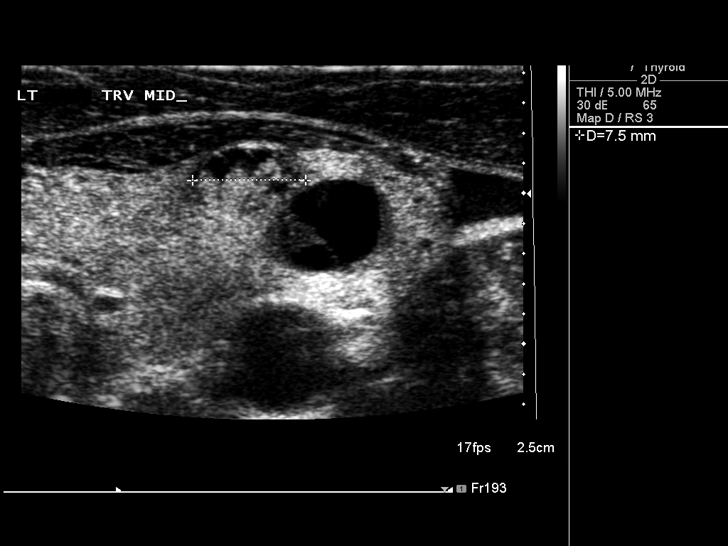
[im 48/64]
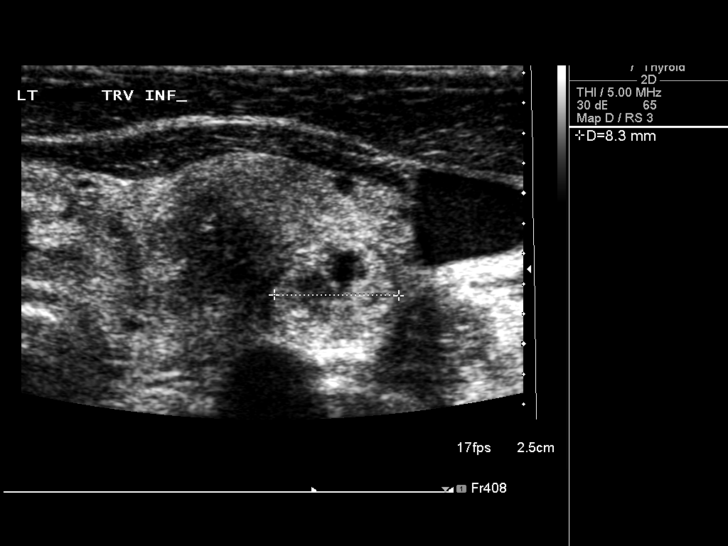
[im 53/64]
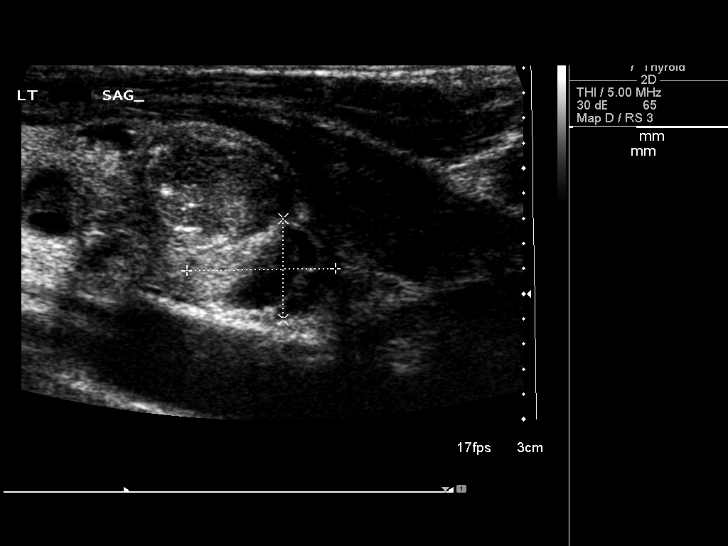
[im 58/64]
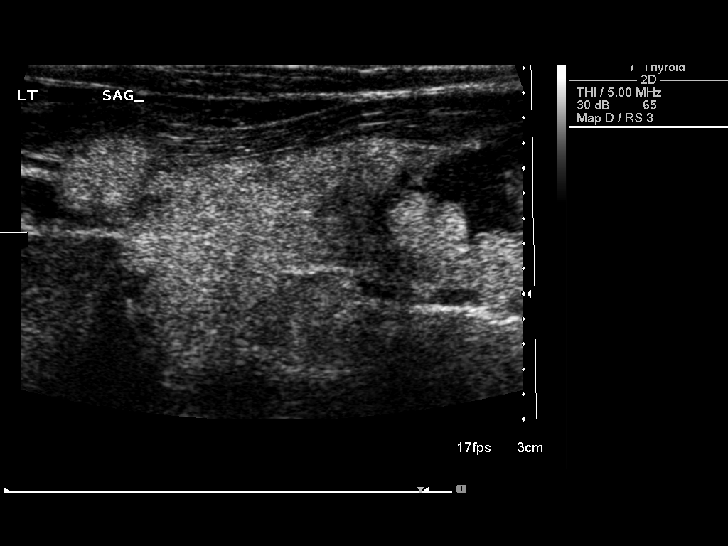
[im 64/64]
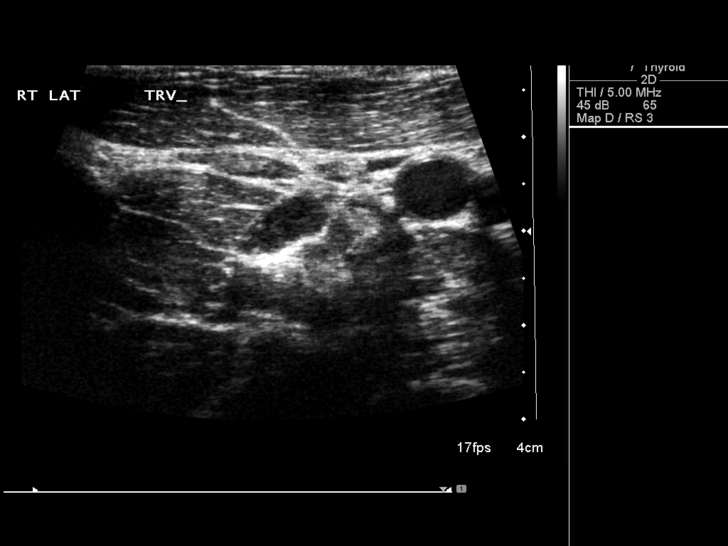

[13 of 25 positions shown; findings below may reference images not displayed]

FINDINGS: Right thyroid lobe

Measurements: 5.6 x 4.8 x 4.8 cm. A large complex mass occupies most
of the right lobe measuring 7.2 x 4.3 x 5.4 cm.

Left thyroid lobe

Measurements: 5.8 x 1.9 x 2.5 cm. Multiple left lobe nodules are
identified. Complex left upper pole nodule measures 2.4 x 1.4 x
cm. Complex lower pole nodule measures 1.3 x 1.0 x 1.1 cm and
contains microcalcifications. Other smaller nodules are scattered
throughout the left lobe.

Isthmus

Thickness: 7 mm.  Complex nodule measures 3.1 x 2.0 x 2.3 cm

Lymphadenopathy

None visualized.
IMPRESSION: Multiple bilateral solid and complex nodules. The dominant nodule on
the right measures 7.2 cm. There is a left lower pole complex nodule
with microcalcifications measuring 1.3 cm. Other nodules are also
described above with varying sizes. Findings meet consensus criteria
for biopsy. Ultrasound-guided fine needle aspiration should be
considered, as per the consensus statement: Management of Thyroid
Nodules Detected at US: Society of Radiologists in Ultrasound

## 2016-07-07 ENCOUNTER — Encounter (HOSPITAL_COMMUNITY)
Admission: RE | Admit: 2016-07-07 | Discharge: 2016-07-07 | Disposition: A | Discharge status: Home / Self Care | Payer: Managed Care, Other (non HMO) | Source: Ambulatory Visit | Attending: Dermatology | Admitting: Dermatology

## 2016-07-07 DIAGNOSIS — L732 Hidradenitis suppurativa: Secondary | ICD-10-CM | POA: Insufficient documentation

## 2016-07-07 MED ORDER — SODIUM CHLORIDE 0.9 % IV SOLN
7.5000 mg/kg | INTRAVENOUS | Status: DC
  Administered 2016-07-07: 400 mg via INTRAVENOUS
  Filled 2016-07-07: qty 40

## 2016-07-07 MED ORDER — DIPHENHYDRAMINE HCL 25 MG PO CAPS: 50.0000 mg | ORAL_CAPSULE | ORAL | Status: DC

## 2016-07-07 MED ORDER — ACETAMINOPHEN 325 MG PO TABS: 650.0000 mg | ORAL_TABLET | ORAL | Status: DC

## 2016-07-07 MED ORDER — SODIUM CHLORIDE 0.9 % IV SOLN: INTRAVENOUS | Status: DC

## 2016-08-04 ENCOUNTER — Inpatient Hospital Stay (HOSPITAL_COMMUNITY): Admission: RE | Admit: 2016-08-04 | Payer: Managed Care, Other (non HMO) | Source: Ambulatory Visit

## 2016-08-05 ENCOUNTER — Encounter (HOSPITAL_COMMUNITY)
Admission: RE | Admit: 2016-08-05 | Discharge: 2016-08-05 | Disposition: A | Discharge status: Home / Self Care | Payer: Managed Care, Other (non HMO) | Source: Ambulatory Visit | Attending: Dermatology | Admitting: Dermatology

## 2016-08-05 DIAGNOSIS — L732 Hidradenitis suppurativa: Secondary | ICD-10-CM | POA: Diagnosis not present

## 2016-08-05 MED ORDER — SODIUM CHLORIDE 0.9 % IV SOLN
7.5000 mg/kg | INTRAVENOUS | Status: DC
  Administered 2016-08-05: 400 mg via INTRAVENOUS
  Filled 2016-08-05: qty 40

## 2016-08-05 MED ORDER — SODIUM CHLORIDE 0.9 % IV SOLN
INTRAVENOUS | Status: DC
  Administered 2016-08-05: 09:00:00 via INTRAVENOUS

## 2016-08-05 MED ORDER — DIPHENHYDRAMINE HCL 25 MG PO CAPS: 50.0000 mg | ORAL_CAPSULE | ORAL | Status: DC

## 2016-08-05 MED ORDER — ACETAMINOPHEN 325 MG PO TABS: 650.0000 mg | ORAL_TABLET | ORAL | Status: DC

## 2016-08-31 ENCOUNTER — Other Ambulatory Visit (HOSPITAL_COMMUNITY): Payer: Self-pay | Admitting: *Deleted

## 2016-09-01 ENCOUNTER — Ambulatory Visit (HOSPITAL_COMMUNITY)
Admission: RE | Admit: 2016-09-01 | Discharge: 2016-09-01 | Disposition: A | Discharge status: Home / Self Care | Payer: Managed Care, Other (non HMO) | Source: Ambulatory Visit | Attending: Dermatology | Admitting: Dermatology

## 2016-09-01 DIAGNOSIS — L732 Hidradenitis suppurativa: Secondary | ICD-10-CM | POA: Insufficient documentation

## 2016-09-01 MED ORDER — DIPHENHYDRAMINE HCL 25 MG PO CAPS: 50.0000 mg | ORAL_CAPSULE | ORAL | Status: DC

## 2016-09-01 MED ORDER — ACETAMINOPHEN 325 MG PO TABS
650.0000 mg | ORAL_TABLET | ORAL | Status: DC
Start: 2016-09-02 — End: 2016-09-02

## 2016-09-01 MED ORDER — SODIUM CHLORIDE 0.9 % IV SOLN
INTRAVENOUS | Status: DC
  Administered 2016-09-01: 10:00:00 via INTRAVENOUS

## 2016-09-01 MED ORDER — SODIUM CHLORIDE 0.9 % IV SOLN
7.5000 mg/kg | INTRAVENOUS | Status: DC
  Administered 2016-09-01: 400 mg via INTRAVENOUS
  Filled 2016-09-01: qty 40

## 2016-09-28 ENCOUNTER — Other Ambulatory Visit (HOSPITAL_COMMUNITY): Payer: Self-pay | Admitting: *Deleted

## 2016-09-29 ENCOUNTER — Ambulatory Visit (HOSPITAL_COMMUNITY)
Admission: RE | Admit: 2016-09-29 | Discharge: 2016-09-29 | Disposition: A | Discharge status: Home / Self Care | Payer: Managed Care, Other (non HMO) | Source: Ambulatory Visit | Attending: Dermatology | Admitting: Dermatology

## 2016-09-29 DIAGNOSIS — L732 Hidradenitis suppurativa: Secondary | ICD-10-CM | POA: Insufficient documentation

## 2016-09-29 MED ORDER — DIPHENHYDRAMINE HCL 25 MG PO CAPS: 50.0000 mg | ORAL_CAPSULE | ORAL | Status: DC

## 2016-09-29 MED ORDER — ACETAMINOPHEN 325 MG PO TABS: 650.0000 mg | ORAL_TABLET | ORAL | Status: DC

## 2016-09-29 MED ORDER — SODIUM CHLORIDE 0.9 % IV SOLN
INTRAVENOUS | Status: AC
  Administered 2016-09-29: 10:00:00 via INTRAVENOUS

## 2016-09-29 MED ORDER — SODIUM CHLORIDE 0.9 % IV SOLN
7.5000 mg/kg | INTRAVENOUS | Status: AC
  Administered 2016-09-29: 400 mg via INTRAVENOUS
  Filled 2016-09-29: qty 40

## 2016-10-27 ENCOUNTER — Ambulatory Visit (HOSPITAL_COMMUNITY)
Admission: RE | Admit: 2016-10-27 | Discharge: 2016-10-27 | Disposition: A | Discharge status: Home / Self Care | Payer: Managed Care, Other (non HMO) | Source: Ambulatory Visit | Attending: Dermatology | Admitting: Dermatology

## 2016-10-27 DIAGNOSIS — L732 Hidradenitis suppurativa: Secondary | ICD-10-CM | POA: Diagnosis not present

## 2016-10-27 MED ORDER — ACETAMINOPHEN 325 MG PO TABS: 650.0000 mg | ORAL_TABLET | ORAL | Status: DC

## 2016-10-27 MED ORDER — SODIUM CHLORIDE 0.9 % IV SOLN
7.5000 mg/kg | INTRAVENOUS | Status: DC
  Administered 2016-10-27: 400 mg via INTRAVENOUS
  Filled 2016-10-27: qty 40

## 2016-10-27 MED ORDER — DIPHENHYDRAMINE HCL 25 MG PO CAPS: 50.0000 mg | ORAL_CAPSULE | ORAL | Status: DC

## 2016-11-24 ENCOUNTER — Encounter (HOSPITAL_COMMUNITY): Payer: Managed Care, Other (non HMO)

## 2016-12-01 ENCOUNTER — Ambulatory Visit (HOSPITAL_COMMUNITY)
Admission: RE | Admit: 2016-12-01 | Discharge: 2016-12-01 | Disposition: A | Discharge status: Home / Self Care | Payer: Managed Care, Other (non HMO) | Source: Ambulatory Visit | Attending: Dermatology | Admitting: Dermatology

## 2016-12-01 DIAGNOSIS — L732 Hidradenitis suppurativa: Secondary | ICD-10-CM | POA: Insufficient documentation

## 2016-12-01 MED ORDER — ACETAMINOPHEN 325 MG PO TABS
650.0000 mg | ORAL_TABLET | ORAL | Status: DC
Start: 2016-12-01 — End: 2016-12-02

## 2016-12-01 MED ORDER — SODIUM CHLORIDE 0.9 % IV SOLN
7.5000 mg/kg | INTRAVENOUS | Status: DC
  Administered 2016-12-01: 400 mg via INTRAVENOUS
  Filled 2016-12-01: qty 40

## 2016-12-01 MED ORDER — DIPHENHYDRAMINE HCL 25 MG PO CAPS: 50.0000 mg | ORAL_CAPSULE | ORAL | Status: DC

## 2016-12-29 ENCOUNTER — Ambulatory Visit (HOSPITAL_COMMUNITY)
Admission: RE | Admit: 2016-12-29 | Discharge: 2016-12-29 | Disposition: A | Discharge status: Home / Self Care | Payer: Managed Care, Other (non HMO) | Source: Ambulatory Visit | Attending: Dermatology | Admitting: Dermatology

## 2016-12-29 DIAGNOSIS — L732 Hidradenitis suppurativa: Secondary | ICD-10-CM | POA: Diagnosis not present

## 2016-12-29 MED ORDER — DIPHENHYDRAMINE HCL 25 MG PO CAPS: 50.0000 mg | ORAL_CAPSULE | ORAL | Status: DC

## 2016-12-29 MED ORDER — ACETAMINOPHEN 325 MG PO TABS
650.0000 mg | ORAL_TABLET | ORAL | Status: DC
Start: 2016-12-29 — End: 2016-12-30

## 2016-12-29 MED ORDER — SODIUM CHLORIDE 0.9 % IV SOLN
INTRAVENOUS | Status: DC
  Administered 2016-12-29: 10:00:00 via INTRAVENOUS

## 2016-12-29 MED ORDER — INFLIXIMAB 100 MG IV SOLR
7.5000 mg/kg | INTRAVENOUS | Status: DC
  Administered 2016-12-29: 400 mg via INTRAVENOUS
  Filled 2016-12-29: qty 40

## 2017-01-24 ENCOUNTER — Other Ambulatory Visit (HOSPITAL_COMMUNITY): Payer: Self-pay

## 2017-01-25 ENCOUNTER — Encounter (HOSPITAL_COMMUNITY)
Admission: RE | Admit: 2017-01-25 | Discharge: 2017-01-25 | Disposition: A | Discharge status: Home / Self Care | Payer: Managed Care, Other (non HMO) | Source: Ambulatory Visit | Attending: Dermatology | Admitting: Dermatology

## 2017-01-25 DIAGNOSIS — L732 Hidradenitis suppurativa: Secondary | ICD-10-CM | POA: Insufficient documentation

## 2017-01-25 MED ORDER — DIPHENHYDRAMINE HCL 25 MG PO CAPS: 50.0000 mg | ORAL_CAPSULE | Freq: Once | ORAL | Status: DC

## 2017-01-25 MED ORDER — SODIUM CHLORIDE 0.9 % IV SOLN
INTRAVENOUS | Status: DC
  Administered 2017-01-25: 10:00:00 via INTRAVENOUS

## 2017-01-25 MED ORDER — ACETAMINOPHEN 325 MG PO TABS: 650.0000 mg | ORAL_TABLET | Freq: Once | ORAL | Status: DC

## 2017-01-25 MED ORDER — SODIUM CHLORIDE 0.9 % IV SOLN
10.0000 mg/kg | INTRAVENOUS | Status: DC
  Administered 2017-01-25: 600 mg via INTRAVENOUS
  Filled 2017-01-25: qty 60

## 2017-02-21 ENCOUNTER — Other Ambulatory Visit (HOSPITAL_COMMUNITY): Payer: Self-pay

## 2017-02-22 ENCOUNTER — Ambulatory Visit (HOSPITAL_COMMUNITY)
Admission: RE | Admit: 2017-02-22 | Discharge: 2017-02-22 | Disposition: A | Discharge status: Home / Self Care | Payer: Managed Care, Other (non HMO) | Source: Ambulatory Visit | Attending: Dermatology | Admitting: Dermatology

## 2017-02-22 DIAGNOSIS — L732 Hidradenitis suppurativa: Secondary | ICD-10-CM | POA: Insufficient documentation

## 2017-02-22 MED ORDER — ACETAMINOPHEN 325 MG PO TABS: 650.0000 mg | ORAL_TABLET | Freq: Once | ORAL | Status: DC

## 2017-02-22 MED ORDER — SODIUM CHLORIDE 0.9 % IV SOLN
INTRAVENOUS | Status: DC
  Administered 2017-02-22: 10:00:00 via INTRAVENOUS

## 2017-02-22 MED ORDER — SODIUM CHLORIDE 0.9 % IV SOLN
10.0000 mg/kg | INTRAVENOUS | Status: DC
  Administered 2017-02-22: 600 mg via INTRAVENOUS
  Filled 2017-02-22: qty 60

## 2017-02-22 MED ORDER — DIPHENHYDRAMINE HCL 25 MG PO CAPS: 25.0000 mg | ORAL_CAPSULE | Freq: Once | ORAL | Status: DC

## 2017-03-22 ENCOUNTER — Ambulatory Visit (HOSPITAL_COMMUNITY)
Admission: RE | Admit: 2017-03-22 | Discharge: 2017-03-22 | Disposition: A | Discharge status: Home / Self Care | Payer: Managed Care, Other (non HMO) | Source: Ambulatory Visit | Attending: Dermatology | Admitting: Dermatology

## 2017-03-22 DIAGNOSIS — L732 Hidradenitis suppurativa: Secondary | ICD-10-CM | POA: Diagnosis not present

## 2017-03-22 MED ORDER — ACETAMINOPHEN 325 MG PO TABS: 650.0000 mg | ORAL_TABLET | Freq: Once | ORAL | Status: DC

## 2017-03-22 MED ORDER — SODIUM CHLORIDE 0.9 % IV SOLN
10.0000 mg/kg | INTRAVENOUS | Status: DC
  Administered 2017-03-22: 600 mg via INTRAVENOUS
  Filled 2017-03-22: qty 60

## 2017-03-22 MED ORDER — DIPHENHYDRAMINE HCL 25 MG PO CAPS: 25.0000 mg | ORAL_CAPSULE | Freq: Once | ORAL | Status: DC

## 2017-03-22 MED ORDER — SODIUM CHLORIDE 0.9 % IV SOLN
INTRAVENOUS | Status: DC
  Administered 2017-03-22: 10:00:00 via INTRAVENOUS

## 2017-04-18 ENCOUNTER — Other Ambulatory Visit (HOSPITAL_COMMUNITY): Payer: Self-pay | Admitting: *Deleted

## 2017-04-19 ENCOUNTER — Ambulatory Visit (HOSPITAL_COMMUNITY)
Admission: RE | Admit: 2017-04-19 | Discharge: 2017-04-19 | Disposition: A | Discharge status: Home / Self Care | Payer: Managed Care, Other (non HMO) | Source: Ambulatory Visit | Attending: Dermatology | Admitting: Dermatology

## 2017-04-19 DIAGNOSIS — L732 Hidradenitis suppurativa: Secondary | ICD-10-CM | POA: Insufficient documentation

## 2017-04-19 MED ORDER — SODIUM CHLORIDE 0.9 % IV SOLN
10.0000 mg/kg | INTRAVENOUS | Status: DC
  Administered 2017-04-19: 600 mg via INTRAVENOUS
  Filled 2017-04-19 (×2): qty 60

## 2017-04-19 MED ORDER — SODIUM CHLORIDE 0.9 % IV SOLN
INTRAVENOUS | Status: DC
  Administered 2017-04-19: 11:00:00 via INTRAVENOUS

## 2017-04-19 MED ORDER — ACETAMINOPHEN 325 MG PO TABS: 650.0000 mg | ORAL_TABLET | Freq: Once | ORAL | Status: DC

## 2017-04-19 MED ORDER — DIPHENHYDRAMINE HCL 25 MG PO CAPS: 25.0000 mg | ORAL_CAPSULE | Freq: Once | ORAL | Status: DC

## 2017-05-17 ENCOUNTER — Ambulatory Visit (HOSPITAL_COMMUNITY)
Admission: RE | Admit: 2017-05-17 | Discharge: 2017-05-17 | Disposition: A | Discharge status: Home / Self Care | Payer: Managed Care, Other (non HMO) | Source: Ambulatory Visit | Attending: Dermatology | Admitting: Dermatology

## 2017-05-17 DIAGNOSIS — L732 Hidradenitis suppurativa: Secondary | ICD-10-CM | POA: Insufficient documentation

## 2017-05-17 MED ORDER — SODIUM CHLORIDE 0.9 % IV SOLN
INTRAVENOUS | Status: DC
  Administered 2017-05-17: 10:00:00 via INTRAVENOUS

## 2017-05-17 MED ORDER — ACETAMINOPHEN 325 MG PO TABS: 650.0000 mg | ORAL_TABLET | Freq: Once | ORAL | Status: DC

## 2017-05-17 MED ORDER — DIPHENHYDRAMINE HCL 25 MG PO CAPS: 25.0000 mg | ORAL_CAPSULE | Freq: Once | ORAL | Status: DC

## 2017-05-17 MED ORDER — SODIUM CHLORIDE 0.9 % IV SOLN
10.0000 mg/kg | INTRAVENOUS | Status: DC
  Administered 2017-05-17: 600 mg via INTRAVENOUS
  Filled 2017-05-17 (×2): qty 60

## 2017-06-13 ENCOUNTER — Other Ambulatory Visit (HOSPITAL_COMMUNITY): Payer: Self-pay | Admitting: *Deleted

## 2017-06-14 ENCOUNTER — Encounter (HOSPITAL_COMMUNITY)
Admission: RE | Admit: 2017-06-14 | Discharge: 2017-06-14 | Disposition: A | Discharge status: Home / Self Care | Payer: Managed Care, Other (non HMO) | Source: Ambulatory Visit | Attending: Dermatology | Admitting: Dermatology

## 2017-06-14 DIAGNOSIS — L732 Hidradenitis suppurativa: Secondary | ICD-10-CM | POA: Insufficient documentation

## 2017-06-14 MED ORDER — SODIUM CHLORIDE 0.9 % IV SOLN
10.0000 mg/kg | INTRAVENOUS | Status: DC
  Administered 2017-06-14: 600 mg via INTRAVENOUS
  Filled 2017-06-14: qty 60

## 2017-06-14 MED ORDER — ACETAMINOPHEN 325 MG PO TABS: 650.0000 mg | ORAL_TABLET | Freq: Once | ORAL | Status: DC

## 2017-06-14 MED ORDER — SODIUM CHLORIDE 0.9 % IV SOLN
INTRAVENOUS | Status: DC
  Administered 2017-06-14: 10:00:00 via INTRAVENOUS

## 2017-06-14 MED ORDER — DIPHENHYDRAMINE HCL 25 MG PO CAPS: 25.0000 mg | ORAL_CAPSULE | Freq: Once | ORAL | Status: DC

## 2017-07-11 ENCOUNTER — Other Ambulatory Visit (HOSPITAL_COMMUNITY): Payer: Self-pay

## 2017-07-12 ENCOUNTER — Ambulatory Visit (HOSPITAL_COMMUNITY)
Admission: RE | Admit: 2017-07-12 | Discharge: 2017-07-12 | Disposition: A | Discharge status: Home / Self Care | Payer: Managed Care, Other (non HMO) | Source: Ambulatory Visit | Attending: Dermatology | Admitting: Dermatology

## 2017-07-12 DIAGNOSIS — L732 Hidradenitis suppurativa: Secondary | ICD-10-CM | POA: Insufficient documentation

## 2017-07-12 MED ORDER — ACETAMINOPHEN 325 MG PO TABS: 650.0000 mg | ORAL_TABLET | Freq: Once | ORAL | Status: DC

## 2017-07-12 MED ORDER — DIPHENHYDRAMINE HCL 25 MG PO CAPS: 25.0000 mg | ORAL_CAPSULE | Freq: Once | ORAL | Status: DC

## 2017-07-12 MED ORDER — SODIUM CHLORIDE 0.9 % IV SOLN
10.0000 mg/kg | INTRAVENOUS | Status: DC
  Administered 2017-07-12: 600 mg via INTRAVENOUS
  Filled 2017-07-12: qty 60

## 2017-07-12 MED ORDER — SODIUM CHLORIDE 0.9 % IV SOLN
INTRAVENOUS | Status: DC
Start: 2017-07-12 — End: 2017-07-13

## 2017-08-09 ENCOUNTER — Ambulatory Visit (HOSPITAL_COMMUNITY): Admission: RE | Admit: 2017-08-09 | Payer: Managed Care, Other (non HMO) | Source: Ambulatory Visit

## 2017-08-09 ENCOUNTER — Encounter (HOSPITAL_COMMUNITY): Payer: Self-pay

## 2017-08-11 ENCOUNTER — Other Ambulatory Visit (HOSPITAL_COMMUNITY): Payer: Self-pay | Admitting: *Deleted

## 2017-08-12 ENCOUNTER — Ambulatory Visit (HOSPITAL_COMMUNITY)
Admission: RE | Admit: 2017-08-12 | Discharge: 2017-08-12 | Disposition: A | Discharge status: Home / Self Care | Payer: Managed Care, Other (non HMO) | Source: Ambulatory Visit | Attending: Dermatology | Admitting: Dermatology

## 2017-08-12 DIAGNOSIS — L732 Hidradenitis suppurativa: Secondary | ICD-10-CM | POA: Insufficient documentation

## 2017-08-12 MED ORDER — ACETAMINOPHEN 325 MG PO TABS: 650.0000 mg | ORAL_TABLET | ORAL | Status: DC

## 2017-08-12 MED ORDER — SODIUM CHLORIDE 0.9 % IV SOLN
10.0000 mg/kg | INTRAVENOUS | Status: DC
  Administered 2017-08-12: 600 mg via INTRAVENOUS
  Filled 2017-08-12: qty 60

## 2017-08-12 MED ORDER — DIPHENHYDRAMINE HCL 25 MG PO CAPS: 25.0000 mg | ORAL_CAPSULE | ORAL | Status: DC

## 2017-09-06 ENCOUNTER — Encounter (HOSPITAL_COMMUNITY): Payer: Managed Care, Other (non HMO)

## 2017-09-09 ENCOUNTER — Encounter (HOSPITAL_COMMUNITY): Payer: Managed Care, Other (non HMO)

## 2017-09-21 ENCOUNTER — Ambulatory Visit (HOSPITAL_COMMUNITY)
Admission: RE | Admit: 2017-09-21 | Discharge: 2017-09-21 | Disposition: A | Discharge status: Home / Self Care | Payer: Managed Care, Other (non HMO) | Source: Ambulatory Visit | Attending: Dermatology | Admitting: Dermatology

## 2017-09-21 DIAGNOSIS — L732 Hidradenitis suppurativa: Secondary | ICD-10-CM | POA: Diagnosis not present

## 2017-09-21 MED ORDER — ACETAMINOPHEN 325 MG PO TABS: 650.0000 mg | ORAL_TABLET | ORAL | Status: DC

## 2017-09-21 MED ORDER — SODIUM CHLORIDE 0.9 % IV SOLN
10.0000 mg/kg | INTRAVENOUS | Status: DC
  Administered 2017-09-21: 600 mg via INTRAVENOUS
  Filled 2017-09-21: qty 60

## 2017-09-21 MED ORDER — DIPHENHYDRAMINE HCL 25 MG PO CAPS: 25.0000 mg | ORAL_CAPSULE | ORAL | Status: DC

## 2017-10-18 ENCOUNTER — Other Ambulatory Visit (HOSPITAL_COMMUNITY): Payer: Self-pay | Admitting: *Deleted

## 2017-10-19 ENCOUNTER — Ambulatory Visit (HOSPITAL_COMMUNITY)
Admission: RE | Admit: 2017-10-19 | Discharge: 2017-10-19 | Disposition: A | Discharge status: Home / Self Care | Payer: Managed Care, Other (non HMO) | Source: Ambulatory Visit | Attending: Dermatology | Admitting: Dermatology

## 2017-10-19 DIAGNOSIS — L732 Hidradenitis suppurativa: Secondary | ICD-10-CM | POA: Diagnosis not present

## 2017-10-19 MED ORDER — DIPHENHYDRAMINE HCL 25 MG PO CAPS: 25.0000 mg | ORAL_CAPSULE | ORAL | Status: DC

## 2017-10-19 MED ORDER — ACETAMINOPHEN 325 MG PO TABS: 650.0000 mg | ORAL_TABLET | ORAL | Status: DC

## 2017-10-19 MED ORDER — SODIUM CHLORIDE 0.9 % IV SOLN
10.0000 mg/kg | INTRAVENOUS | Status: DC
  Administered 2017-10-19: 600 mg via INTRAVENOUS
  Filled 2017-10-19: qty 60

## 2017-10-19 MED ORDER — SODIUM CHLORIDE 0.9 % IV SOLN
INTRAVENOUS | Status: DC
  Administered 2017-10-19: 09:00:00 via INTRAVENOUS

## 2017-11-16 ENCOUNTER — Encounter (HOSPITAL_COMMUNITY): Payer: Managed Care, Other (non HMO)

## 2017-11-21 ENCOUNTER — Encounter (HOSPITAL_COMMUNITY): Payer: Managed Care, Other (non HMO)

## 2017-11-22 ENCOUNTER — Ambulatory Visit (HOSPITAL_COMMUNITY)
Admission: RE | Admit: 2017-11-22 | Discharge: 2017-11-22 | Disposition: A | Discharge status: Home / Self Care | Payer: Managed Care, Other (non HMO) | Source: Ambulatory Visit | Attending: Dermatology | Admitting: Dermatology

## 2017-11-22 DIAGNOSIS — L732 Hidradenitis suppurativa: Secondary | ICD-10-CM | POA: Diagnosis not present

## 2017-11-22 MED ORDER — SODIUM CHLORIDE 0.9 % IV SOLN
10.0000 mg/kg | INTRAVENOUS | Status: DC
  Administered 2017-11-22: 600 mg via INTRAVENOUS
  Filled 2017-11-22: qty 60

## 2017-11-22 MED ORDER — ACETAMINOPHEN 325 MG PO TABS: 650.0000 mg | ORAL_TABLET | ORAL | Status: DC

## 2017-11-22 MED ORDER — DIPHENHYDRAMINE HCL 25 MG PO CAPS: 50.0000 mg | ORAL_CAPSULE | ORAL | Status: DC

## 2017-11-22 MED ORDER — SODIUM CHLORIDE 0.9 % IV SOLN
INTRAVENOUS | Status: DC
  Administered 2017-11-22: 10:00:00 via INTRAVENOUS

## 2017-12-20 ENCOUNTER — Ambulatory Visit (HOSPITAL_COMMUNITY)
Admission: RE | Admit: 2017-12-20 | Discharge: 2017-12-20 | Disposition: A | Discharge status: Home / Self Care | Payer: Managed Care, Other (non HMO) | Source: Ambulatory Visit | Attending: Dermatology | Admitting: Dermatology

## 2017-12-20 DIAGNOSIS — L732 Hidradenitis suppurativa: Secondary | ICD-10-CM | POA: Diagnosis not present

## 2017-12-20 MED ORDER — ACETAMINOPHEN 325 MG PO TABS: 650.0000 mg | ORAL_TABLET | ORAL | Status: DC

## 2017-12-20 MED ORDER — DIPHENHYDRAMINE HCL 25 MG PO CAPS: 50.0000 mg | ORAL_CAPSULE | ORAL | Status: DC

## 2017-12-20 MED ORDER — SODIUM CHLORIDE 0.9 % IV SOLN
INTRAVENOUS | Status: DC
  Administered 2017-12-20: 10:00:00 via INTRAVENOUS

## 2017-12-20 MED ORDER — SODIUM CHLORIDE 0.9 % IV SOLN
10.0000 mg/kg | INTRAVENOUS | Status: DC
  Administered 2017-12-20: 600 mg via INTRAVENOUS
  Filled 2017-12-20: qty 60

## 2018-01-17 ENCOUNTER — Ambulatory Visit (HOSPITAL_COMMUNITY)
Admission: RE | Admit: 2018-01-17 | Discharge: 2018-01-17 | Disposition: A | Discharge status: Home / Self Care | Payer: Managed Care, Other (non HMO) | Source: Ambulatory Visit | Attending: Dermatology | Admitting: Dermatology

## 2018-01-17 DIAGNOSIS — L732 Hidradenitis suppurativa: Secondary | ICD-10-CM | POA: Diagnosis not present

## 2018-01-17 MED ORDER — ACETAMINOPHEN 325 MG PO TABS: 650.0000 mg | ORAL_TABLET | ORAL | Status: DC

## 2018-01-17 MED ORDER — SODIUM CHLORIDE 0.9 % IV SOLN: INTRAVENOUS | Status: DC

## 2018-01-17 MED ORDER — DIPHENHYDRAMINE HCL 25 MG PO CAPS: 50.0000 mg | ORAL_CAPSULE | ORAL | Status: DC

## 2018-01-17 MED ORDER — SODIUM CHLORIDE 0.9 % IV SOLN
10.0000 mg/kg | INTRAVENOUS | Status: DC
  Administered 2018-01-17: 600 mg via INTRAVENOUS
  Filled 2018-01-17: qty 60

## 2018-02-14 ENCOUNTER — Inpatient Hospital Stay (HOSPITAL_COMMUNITY): Admission: RE | Admit: 2018-02-14 | Payer: Managed Care, Other (non HMO) | Source: Ambulatory Visit

## 2018-02-21 ENCOUNTER — Inpatient Hospital Stay (HOSPITAL_COMMUNITY): Admission: RE | Admit: 2018-02-21 | Payer: Managed Care, Other (non HMO) | Source: Ambulatory Visit

## 2018-02-28 ENCOUNTER — Other Ambulatory Visit (HOSPITAL_COMMUNITY): Payer: Self-pay

## 2018-03-02 ENCOUNTER — Ambulatory Visit (HOSPITAL_COMMUNITY)
Admission: RE | Admit: 2018-03-02 | Discharge: 2018-03-02 | Disposition: A | Discharge status: Home / Self Care | Payer: Managed Care, Other (non HMO) | Source: Ambulatory Visit | Attending: Dermatology | Admitting: Dermatology

## 2018-03-02 DIAGNOSIS — L732 Hidradenitis suppurativa: Secondary | ICD-10-CM | POA: Diagnosis not present

## 2018-03-02 MED ORDER — DIPHENHYDRAMINE HCL 25 MG PO CAPS: 50.0000 mg | ORAL_CAPSULE | Freq: Four times a day (QID) | ORAL | Status: DC | PRN

## 2018-03-02 MED ORDER — SODIUM CHLORIDE 0.9 % IV SOLN
10.0000 mg/kg | INTRAVENOUS | Status: DC
  Administered 2018-03-02: 600 mg via INTRAVENOUS
  Filled 2018-03-02: qty 60

## 2018-03-02 MED ORDER — SODIUM CHLORIDE 0.9 % IV SOLN: INTRAVENOUS | Status: DC

## 2018-03-02 MED ORDER — ACETAMINOPHEN 325 MG PO TABS: 650.0000 mg | ORAL_TABLET | Freq: Four times a day (QID) | ORAL | Status: DC | PRN

## 2018-03-14 ENCOUNTER — Encounter (HOSPITAL_COMMUNITY): Payer: Managed Care, Other (non HMO)

## 2018-03-30 ENCOUNTER — Ambulatory Visit (HOSPITAL_COMMUNITY)
Admission: RE | Admit: 2018-03-30 | Discharge: 2018-03-30 | Disposition: A | Discharge status: Home / Self Care | Payer: Managed Care, Other (non HMO) | Source: Ambulatory Visit | Attending: Dermatology | Admitting: Dermatology

## 2018-03-30 DIAGNOSIS — L732 Hidradenitis suppurativa: Secondary | ICD-10-CM | POA: Insufficient documentation

## 2018-03-30 MED ORDER — ACETAMINOPHEN 325 MG PO TABS: 650.0000 mg | ORAL_TABLET | Freq: Four times a day (QID) | ORAL | Status: DC | PRN

## 2018-03-30 MED ORDER — DIPHENHYDRAMINE HCL 25 MG PO CAPS: 50.0000 mg | ORAL_CAPSULE | Freq: Four times a day (QID) | ORAL | Status: DC | PRN

## 2018-03-30 MED ORDER — SODIUM CHLORIDE 0.9 % IV SOLN: INTRAVENOUS | Status: DC

## 2018-03-30 MED ORDER — SODIUM CHLORIDE 0.9 % IV SOLN
10.0000 mg/kg | INTRAVENOUS | Status: DC
  Administered 2018-03-30: 600 mg via INTRAVENOUS
  Filled 2018-03-30: qty 60

## 2018-04-26 ENCOUNTER — Encounter (HOSPITAL_COMMUNITY): Payer: Managed Care, Other (non HMO)

## 2018-04-27 ENCOUNTER — Encounter (HOSPITAL_COMMUNITY)
Admission: RE | Admit: 2018-04-27 | Discharge: 2018-04-27 | Disposition: A | Discharge status: Home / Self Care | Payer: Managed Care, Other (non HMO) | Source: Ambulatory Visit | Attending: Dermatology | Admitting: Dermatology

## 2018-04-27 DIAGNOSIS — L732 Hidradenitis suppurativa: Secondary | ICD-10-CM | POA: Insufficient documentation

## 2018-04-27 MED ORDER — SODIUM CHLORIDE 0.9 % IV SOLN
INTRAVENOUS | Status: DC
  Administered 2018-04-27: 09:00:00 via INTRAVENOUS

## 2018-04-27 MED ORDER — ACETAMINOPHEN 325 MG PO TABS: 650.0000 mg | ORAL_TABLET | Freq: Four times a day (QID) | ORAL | Status: DC | PRN

## 2018-04-27 MED ORDER — SODIUM CHLORIDE 0.9 % IV SOLN
10.0000 mg/kg | INTRAVENOUS | Status: DC
  Administered 2018-04-27: 600 mg via INTRAVENOUS
  Filled 2018-04-27: qty 60

## 2018-04-27 MED ORDER — DIPHENHYDRAMINE HCL 25 MG PO CAPS: 50.0000 mg | ORAL_CAPSULE | Freq: Four times a day (QID) | ORAL | Status: DC | PRN

## 2018-05-24 ENCOUNTER — Other Ambulatory Visit: Payer: Self-pay

## 2018-05-25 ENCOUNTER — Encounter (HOSPITAL_COMMUNITY)
Admission: RE | Admit: 2018-05-25 | Discharge: 2018-05-25 | Disposition: A | Discharge status: Home / Self Care | Payer: BLUE CROSS/BLUE SHIELD | Source: Ambulatory Visit | Attending: Dermatology | Admitting: Dermatology

## 2018-05-25 DIAGNOSIS — L732 Hidradenitis suppurativa: Secondary | ICD-10-CM | POA: Insufficient documentation

## 2018-05-25 MED ORDER — DIPHENHYDRAMINE HCL 25 MG PO CAPS: 50.0000 mg | ORAL_CAPSULE | Freq: Four times a day (QID) | ORAL | Status: DC | PRN

## 2018-05-25 MED ORDER — SODIUM CHLORIDE 0.9 % IV SOLN
10.0000 mg/kg | INTRAVENOUS | Status: DC
  Administered 2018-05-25: 600 mg via INTRAVENOUS
  Filled 2018-05-25: qty 60

## 2018-05-25 MED ORDER — SODIUM CHLORIDE 0.9 % IV SOLN
INTRAVENOUS | Status: DC
  Administered 2018-05-25: 09:00:00 via INTRAVENOUS

## 2018-05-25 MED ORDER — ACETAMINOPHEN 325 MG PO TABS: 650.0000 mg | ORAL_TABLET | Freq: Four times a day (QID) | ORAL | Status: DC | PRN

## 2018-06-22 ENCOUNTER — Ambulatory Visit (HOSPITAL_COMMUNITY)
Admission: RE | Admit: 2018-06-22 | Discharge: 2018-06-22 | Disposition: A | Discharge status: Home / Self Care | Payer: BLUE CROSS/BLUE SHIELD | Source: Ambulatory Visit | Attending: Dermatology | Admitting: Dermatology

## 2018-06-22 ENCOUNTER — Other Ambulatory Visit: Payer: Self-pay

## 2018-06-22 DIAGNOSIS — L732 Hidradenitis suppurativa: Secondary | ICD-10-CM | POA: Diagnosis present

## 2018-06-22 MED ORDER — ACETAMINOPHEN 325 MG PO TABS: 650.0000 mg | ORAL_TABLET | Freq: Four times a day (QID) | ORAL | Status: DC | PRN

## 2018-06-22 MED ORDER — SODIUM CHLORIDE 0.9 % IV SOLN: INTRAVENOUS | Status: DC

## 2018-06-22 MED ORDER — DIPHENHYDRAMINE HCL 25 MG PO CAPS: 50.0000 mg | ORAL_CAPSULE | Freq: Four times a day (QID) | ORAL | Status: DC | PRN

## 2018-06-22 MED ORDER — SODIUM CHLORIDE 0.9 % IV SOLN
10.0000 mg/kg | INTRAVENOUS | Status: DC
  Administered 2018-06-22: 600 mg via INTRAVENOUS
  Filled 2018-06-22: qty 60

## 2018-07-20 ENCOUNTER — Ambulatory Visit (HOSPITAL_COMMUNITY)
Admission: RE | Admit: 2018-07-20 | Discharge: 2018-07-20 | Disposition: A | Discharge status: Home / Self Care | Payer: BLUE CROSS/BLUE SHIELD | Source: Ambulatory Visit | Attending: Dermatology | Admitting: Dermatology

## 2018-07-20 ENCOUNTER — Other Ambulatory Visit: Payer: Self-pay

## 2018-07-20 DIAGNOSIS — L732 Hidradenitis suppurativa: Secondary | ICD-10-CM | POA: Insufficient documentation

## 2018-07-20 MED ORDER — DIPHENHYDRAMINE HCL 25 MG PO CAPS: 50.0000 mg | ORAL_CAPSULE | Freq: Four times a day (QID) | ORAL | Status: DC | PRN

## 2018-07-20 MED ORDER — ACETAMINOPHEN 325 MG PO TABS: 650.0000 mg | ORAL_TABLET | Freq: Four times a day (QID) | ORAL | Status: DC | PRN

## 2018-07-20 MED ORDER — SODIUM CHLORIDE 0.9 % IV SOLN
10.0000 mg/kg | INTRAVENOUS | Status: DC
  Administered 2018-07-20: 600 mg via INTRAVENOUS
  Filled 2018-07-20: qty 60

## 2018-07-20 MED ORDER — SODIUM CHLORIDE 0.9 % IV SOLN: INTRAVENOUS | Status: DC

## 2018-08-17 ENCOUNTER — Ambulatory Visit (HOSPITAL_COMMUNITY)
Admission: RE | Admit: 2018-08-17 | Discharge: 2018-08-17 | Disposition: A | Discharge status: Home / Self Care | Payer: BC Managed Care – PPO | Source: Ambulatory Visit | Attending: Dermatology | Admitting: Dermatology

## 2018-08-17 ENCOUNTER — Other Ambulatory Visit: Payer: Self-pay

## 2018-08-17 DIAGNOSIS — L732 Hidradenitis suppurativa: Secondary | ICD-10-CM | POA: Diagnosis present

## 2018-08-17 MED ORDER — SODIUM CHLORIDE 0.9 % IV SOLN
10.0000 mg/kg | INTRAVENOUS | Status: DC
  Administered 2018-08-17: 600 mg via INTRAVENOUS
  Filled 2018-08-17: qty 60

## 2018-08-17 MED ORDER — DIPHENHYDRAMINE HCL 25 MG PO CAPS: 50.0000 mg | ORAL_CAPSULE | Freq: Four times a day (QID) | ORAL | Status: DC | PRN

## 2018-08-17 MED ORDER — ACETAMINOPHEN 325 MG PO TABS: 650.0000 mg | ORAL_TABLET | Freq: Four times a day (QID) | ORAL | Status: DC | PRN

## 2018-08-17 MED ORDER — SODIUM CHLORIDE 0.9 % IV SOLN
INTRAVENOUS | Status: DC
  Administered 2018-08-17: 09:00:00 via INTRAVENOUS

## 2018-09-14 ENCOUNTER — Other Ambulatory Visit: Payer: Self-pay

## 2018-09-14 ENCOUNTER — Ambulatory Visit (HOSPITAL_COMMUNITY)
Admission: RE | Admit: 2018-09-14 | Discharge: 2018-09-14 | Disposition: A | Discharge status: Home / Self Care | Payer: BC Managed Care – PPO | Source: Ambulatory Visit | Attending: Dermatology | Admitting: Dermatology

## 2018-09-14 DIAGNOSIS — L732 Hidradenitis suppurativa: Secondary | ICD-10-CM | POA: Insufficient documentation

## 2018-09-14 MED ORDER — SODIUM CHLORIDE 0.9 % IV SOLN
INTRAVENOUS | Status: DC
  Administered 2018-09-14: 09:00:00 via INTRAVENOUS

## 2018-09-14 MED ORDER — SODIUM CHLORIDE 0.9 % IV SOLN
10.0000 mg/kg | INTRAVENOUS | Status: DC
  Administered 2018-09-14: 600 mg via INTRAVENOUS
  Filled 2018-09-14 (×2): qty 60

## 2018-09-14 MED ORDER — DIPHENHYDRAMINE HCL 25 MG PO CAPS: 50.0000 mg | ORAL_CAPSULE | Freq: Four times a day (QID) | ORAL | Status: DC | PRN

## 2018-09-14 MED ORDER — ACETAMINOPHEN 325 MG PO TABS: 650.0000 mg | ORAL_TABLET | Freq: Four times a day (QID) | ORAL | Status: DC | PRN

## 2018-10-12 ENCOUNTER — Encounter (HOSPITAL_COMMUNITY)
Admission: RE | Admit: 2018-10-12 | Discharge: 2018-10-12 | Disposition: A | Discharge status: Home / Self Care | Payer: BC Managed Care – PPO | Source: Ambulatory Visit | Attending: Dermatology | Admitting: Dermatology

## 2018-10-12 ENCOUNTER — Other Ambulatory Visit: Payer: Self-pay

## 2018-10-12 DIAGNOSIS — L732 Hidradenitis suppurativa: Secondary | ICD-10-CM | POA: Insufficient documentation

## 2018-10-12 MED ORDER — DIPHENHYDRAMINE HCL 25 MG PO CAPS: 50.0000 mg | ORAL_CAPSULE | Freq: Four times a day (QID) | ORAL | Status: DC | PRN

## 2018-10-12 MED ORDER — SODIUM CHLORIDE 0.9 % IV SOLN
10.0000 mg/kg | INTRAVENOUS | Status: DC
  Administered 2018-10-12: 600 mg via INTRAVENOUS
  Filled 2018-10-12: qty 60

## 2018-10-12 MED ORDER — ACETAMINOPHEN 325 MG PO TABS: 650.0000 mg | ORAL_TABLET | Freq: Four times a day (QID) | ORAL | Status: DC | PRN

## 2018-10-12 MED ORDER — SODIUM CHLORIDE 0.9 % IV SOLN
INTRAVENOUS | Status: DC
  Administered 2018-10-12: 08:00:00 via INTRAVENOUS

## 2018-11-09 ENCOUNTER — Other Ambulatory Visit: Payer: Self-pay

## 2018-11-09 ENCOUNTER — Ambulatory Visit (HOSPITAL_COMMUNITY)
Admission: RE | Admit: 2018-11-09 | Discharge: 2018-11-09 | Disposition: A | Discharge status: Home / Self Care | Payer: BC Managed Care – PPO | Source: Ambulatory Visit | Attending: Dermatology | Admitting: Dermatology

## 2018-11-09 DIAGNOSIS — L732 Hidradenitis suppurativa: Secondary | ICD-10-CM | POA: Insufficient documentation

## 2018-11-09 MED ORDER — SODIUM CHLORIDE 0.9 % IV SOLN: INTRAVENOUS | Status: DC

## 2018-11-09 MED ORDER — SODIUM CHLORIDE 0.9 % IV SOLN
10.0000 mg/kg | INTRAVENOUS | Status: DC
  Administered 2018-11-09: 600 mg via INTRAVENOUS
  Filled 2018-11-09: qty 60

## 2018-11-09 MED ORDER — ACETAMINOPHEN 325 MG PO TABS: 650.0000 mg | ORAL_TABLET | Freq: Four times a day (QID) | ORAL | Status: DC | PRN

## 2018-11-09 MED ORDER — DIPHENHYDRAMINE HCL 25 MG PO CAPS: 50.0000 mg | ORAL_CAPSULE | Freq: Four times a day (QID) | ORAL | Status: DC | PRN

## 2018-12-07 ENCOUNTER — Inpatient Hospital Stay (HOSPITAL_COMMUNITY): Admission: RE | Admit: 2018-12-07 | Payer: BC Managed Care – PPO | Source: Ambulatory Visit

## 2018-12-18 ENCOUNTER — Other Ambulatory Visit: Payer: Self-pay

## 2018-12-18 ENCOUNTER — Encounter (HOSPITAL_COMMUNITY)
Admission: RE | Admit: 2018-12-18 | Discharge: 2018-12-18 | Disposition: A | Discharge status: Home / Self Care | Payer: BC Managed Care – PPO | Source: Ambulatory Visit | Attending: Dermatology | Admitting: Dermatology

## 2018-12-18 DIAGNOSIS — L732 Hidradenitis suppurativa: Secondary | ICD-10-CM | POA: Diagnosis present

## 2018-12-18 MED ORDER — SODIUM CHLORIDE 0.9 % IV SOLN
10.0000 mg/kg | INTRAVENOUS | Status: DC
  Administered 2018-12-18: 600 mg via INTRAVENOUS
  Filled 2018-12-18: qty 60

## 2018-12-18 MED ORDER — ACETAMINOPHEN 325 MG PO TABS: 650.0000 mg | ORAL_TABLET | Freq: Four times a day (QID) | ORAL | Status: DC | PRN

## 2018-12-18 MED ORDER — SODIUM CHLORIDE 0.9 % IV SOLN: INTRAVENOUS | Status: DC

## 2018-12-18 MED ORDER — DIPHENHYDRAMINE HCL 25 MG PO CAPS: 50.0000 mg | ORAL_CAPSULE | Freq: Four times a day (QID) | ORAL | Status: DC | PRN

## 2019-01-04 ENCOUNTER — Encounter (HOSPITAL_COMMUNITY): Payer: BC Managed Care – PPO

## 2019-01-12 ENCOUNTER — Other Ambulatory Visit (HOSPITAL_COMMUNITY): Payer: Self-pay | Admitting: *Deleted

## 2019-01-15 ENCOUNTER — Ambulatory Visit (HOSPITAL_COMMUNITY)
Admission: RE | Admit: 2019-01-15 | Discharge: 2019-01-15 | Disposition: A | Discharge status: Home / Self Care | Payer: BC Managed Care – PPO | Source: Ambulatory Visit | Attending: Dermatology | Admitting: Dermatology

## 2019-01-15 ENCOUNTER — Other Ambulatory Visit: Payer: Self-pay

## 2019-01-15 DIAGNOSIS — L732 Hidradenitis suppurativa: Secondary | ICD-10-CM | POA: Insufficient documentation

## 2019-01-15 MED ORDER — DIPHENHYDRAMINE HCL 25 MG PO CAPS: 50.0000 mg | ORAL_CAPSULE | Freq: Four times a day (QID) | ORAL | Status: DC | PRN

## 2019-01-15 MED ORDER — SODIUM CHLORIDE 0.9 % IV SOLN
10.0000 mg/kg | INTRAVENOUS | Status: DC
  Administered 2019-01-15: 600 mg via INTRAVENOUS
  Filled 2019-01-15: qty 60

## 2019-01-15 MED ORDER — ACETAMINOPHEN 325 MG PO TABS: 650.0000 mg | ORAL_TABLET | Freq: Four times a day (QID) | ORAL | Status: DC | PRN

## 2019-01-15 MED ORDER — SODIUM CHLORIDE 0.9 % IV SOLN: INTRAVENOUS | Status: DC

## 2019-02-12 ENCOUNTER — Encounter (HOSPITAL_COMMUNITY)
Admission: RE | Admit: 2019-02-12 | Discharge: 2019-02-12 | Disposition: A | Discharge status: Home / Self Care | Payer: BC Managed Care – PPO | Source: Ambulatory Visit | Attending: Dermatology | Admitting: Dermatology

## 2019-02-12 ENCOUNTER — Other Ambulatory Visit: Payer: Self-pay

## 2019-02-12 DIAGNOSIS — L732 Hidradenitis suppurativa: Secondary | ICD-10-CM | POA: Insufficient documentation

## 2019-02-12 MED ORDER — DIPHENHYDRAMINE HCL 25 MG PO CAPS: 50.0000 mg | ORAL_CAPSULE | Freq: Four times a day (QID) | ORAL | Status: DC | PRN

## 2019-02-12 MED ORDER — SODIUM CHLORIDE 0.9 % IV SOLN
10.0000 mg/kg | INTRAVENOUS | Status: DC
  Administered 2019-02-12: 600 mg via INTRAVENOUS
  Filled 2019-02-12 (×2): qty 60

## 2019-02-12 MED ORDER — ACETAMINOPHEN 325 MG PO TABS: 650.0000 mg | ORAL_TABLET | Freq: Four times a day (QID) | ORAL | Status: DC | PRN

## 2019-02-12 MED ORDER — SODIUM CHLORIDE 0.9 % IV SOLN: INTRAVENOUS | Status: DC

## 2019-03-12 ENCOUNTER — Ambulatory Visit (HOSPITAL_COMMUNITY)
Admission: RE | Admit: 2019-03-12 | Discharge: 2019-03-12 | Disposition: A | Discharge status: Home / Self Care | Payer: BC Managed Care – PPO | Source: Ambulatory Visit | Attending: Dermatology | Admitting: Dermatology

## 2019-03-12 ENCOUNTER — Other Ambulatory Visit: Payer: Self-pay

## 2019-03-12 DIAGNOSIS — L732 Hidradenitis suppurativa: Secondary | ICD-10-CM | POA: Diagnosis present

## 2019-03-12 MED ORDER — SODIUM CHLORIDE 0.9 % IV SOLN
10.0000 mg/kg | INTRAVENOUS | Status: DC
  Administered 2019-03-12: 600 mg via INTRAVENOUS
  Filled 2019-03-12: qty 60

## 2019-03-12 MED ORDER — ACETAMINOPHEN 325 MG PO TABS: 650.0000 mg | ORAL_TABLET | Freq: Four times a day (QID) | ORAL | Status: DC | PRN

## 2019-03-12 MED ORDER — DIPHENHYDRAMINE HCL 25 MG PO CAPS: 50.0000 mg | ORAL_CAPSULE | Freq: Four times a day (QID) | ORAL | Status: DC | PRN

## 2019-03-12 MED ORDER — SODIUM CHLORIDE 0.9 % IV SOLN: INTRAVENOUS | Status: DC

## 2019-04-09 ENCOUNTER — Encounter (HOSPITAL_COMMUNITY)
Admission: RE | Admit: 2019-04-09 | Discharge: 2019-04-09 | Disposition: A | Discharge status: Home / Self Care | Payer: BC Managed Care – PPO | Source: Ambulatory Visit | Attending: Dermatology | Admitting: Dermatology

## 2019-04-09 ENCOUNTER — Other Ambulatory Visit: Payer: Self-pay

## 2019-04-09 DIAGNOSIS — L732 Hidradenitis suppurativa: Secondary | ICD-10-CM | POA: Diagnosis present

## 2019-04-09 MED ORDER — DIPHENHYDRAMINE HCL 25 MG PO CAPS: 50.0000 mg | ORAL_CAPSULE | Freq: Four times a day (QID) | ORAL | Status: DC | PRN

## 2019-04-09 MED ORDER — ACETAMINOPHEN 325 MG PO TABS: 650.0000 mg | ORAL_TABLET | Freq: Four times a day (QID) | ORAL | Status: DC | PRN

## 2019-04-09 MED ORDER — SODIUM CHLORIDE 0.9 % IV SOLN
10.0000 mg/kg | INTRAVENOUS | Status: DC
  Administered 2019-04-09: 600 mg via INTRAVENOUS
  Filled 2019-04-09: qty 60

## 2019-04-09 MED ORDER — SODIUM CHLORIDE 0.9 % IV SOLN: INTRAVENOUS | Status: DC

## 2019-05-07 ENCOUNTER — Other Ambulatory Visit: Payer: Self-pay

## 2019-05-07 ENCOUNTER — Ambulatory Visit (HOSPITAL_COMMUNITY)
Admission: RE | Admit: 2019-05-07 | Discharge: 2019-05-07 | Disposition: A | Discharge status: Home / Self Care | Payer: BC Managed Care – PPO | Source: Ambulatory Visit | Attending: Dermatology | Admitting: Dermatology

## 2019-05-07 DIAGNOSIS — L732 Hidradenitis suppurativa: Secondary | ICD-10-CM | POA: Insufficient documentation

## 2019-05-07 MED ORDER — SODIUM CHLORIDE 0.9 % IV SOLN
10.0000 mg/kg | INTRAVENOUS | Status: DC
  Administered 2019-05-07: 600 mg via INTRAVENOUS
  Filled 2019-05-07: qty 60

## 2019-05-07 MED ORDER — SODIUM CHLORIDE 0.9 % IV SOLN: INTRAVENOUS | Status: DC

## 2019-05-07 MED ORDER — DIPHENHYDRAMINE HCL 25 MG PO CAPS: 50.0000 mg | ORAL_CAPSULE | Freq: Four times a day (QID) | ORAL | Status: DC | PRN

## 2019-05-07 MED ORDER — ACETAMINOPHEN 325 MG PO TABS: 650.0000 mg | ORAL_TABLET | Freq: Four times a day (QID) | ORAL | Status: DC | PRN

## 2019-06-04 ENCOUNTER — Ambulatory Visit (HOSPITAL_COMMUNITY)
Admission: RE | Admit: 2019-06-04 | Discharge: 2019-06-04 | Disposition: A | Discharge status: Home / Self Care | Payer: BC Managed Care – PPO | Source: Ambulatory Visit | Attending: Dermatology | Admitting: Dermatology

## 2019-06-04 ENCOUNTER — Other Ambulatory Visit: Payer: Self-pay

## 2019-06-04 DIAGNOSIS — L732 Hidradenitis suppurativa: Secondary | ICD-10-CM | POA: Diagnosis present

## 2019-06-04 MED ORDER — SODIUM CHLORIDE 0.9 % IV SOLN
10.0000 mg/kg | INTRAVENOUS | Status: DC
  Administered 2019-06-04: 09:00:00 600 mg via INTRAVENOUS
  Filled 2019-06-04 (×2): qty 60

## 2019-06-04 MED ORDER — ACETAMINOPHEN 325 MG PO TABS: 650.0000 mg | ORAL_TABLET | Freq: Four times a day (QID) | ORAL | Status: DC | PRN

## 2019-06-04 MED ORDER — SODIUM CHLORIDE 0.9 % IV SOLN: INTRAVENOUS | Status: DC

## 2019-06-04 MED ORDER — DIPHENHYDRAMINE HCL 25 MG PO CAPS: 50.0000 mg | ORAL_CAPSULE | Freq: Four times a day (QID) | ORAL | Status: DC | PRN

## 2019-07-02 ENCOUNTER — Ambulatory Visit (HOSPITAL_COMMUNITY)
Admission: RE | Admit: 2019-07-02 | Discharge: 2019-07-02 | Disposition: A | Discharge status: Home / Self Care | Payer: BC Managed Care – PPO | Source: Ambulatory Visit | Attending: Dermatology | Admitting: Dermatology

## 2019-07-02 ENCOUNTER — Other Ambulatory Visit: Payer: Self-pay

## 2019-07-02 DIAGNOSIS — L732 Hidradenitis suppurativa: Secondary | ICD-10-CM | POA: Insufficient documentation

## 2019-07-02 MED ORDER — DIPHENHYDRAMINE HCL 25 MG PO CAPS: 50.0000 mg | ORAL_CAPSULE | Freq: Four times a day (QID) | ORAL | Status: DC | PRN

## 2019-07-02 MED ORDER — SODIUM CHLORIDE 0.9 % IV SOLN: INTRAVENOUS | Status: DC

## 2019-07-02 MED ORDER — ACETAMINOPHEN 325 MG PO TABS: 650.0000 mg | ORAL_TABLET | Freq: Four times a day (QID) | ORAL | Status: DC | PRN

## 2019-07-02 MED ORDER — SODIUM CHLORIDE 0.9 % IV SOLN
10.0000 mg/kg | INTRAVENOUS | Status: DC
  Administered 2019-07-02: 600 mg via INTRAVENOUS
  Filled 2019-07-02: qty 60

## 2019-07-30 ENCOUNTER — Ambulatory Visit (HOSPITAL_COMMUNITY)
Admission: RE | Admit: 2019-07-30 | Discharge: 2019-07-30 | Disposition: A | Discharge status: Home / Self Care | Payer: BC Managed Care – PPO | Source: Ambulatory Visit | Attending: Internal Medicine | Admitting: Internal Medicine

## 2019-07-30 ENCOUNTER — Other Ambulatory Visit: Payer: Self-pay

## 2019-07-30 DIAGNOSIS — L732 Hidradenitis suppurativa: Secondary | ICD-10-CM | POA: Diagnosis present

## 2019-07-30 MED ORDER — ACETAMINOPHEN 325 MG PO TABS: 650.0000 mg | ORAL_TABLET | Freq: Four times a day (QID) | ORAL | Status: DC | PRN

## 2019-07-30 MED ORDER — SODIUM CHLORIDE 0.9 % IV SOLN: INTRAVENOUS | Status: DC

## 2019-07-30 MED ORDER — SODIUM CHLORIDE 0.9 % IV SOLN
10.0000 mg/kg | INTRAVENOUS | Status: DC
  Administered 2019-07-30: 600 mg via INTRAVENOUS
  Filled 2019-07-30: qty 60

## 2019-07-30 MED ORDER — DIPHENHYDRAMINE HCL 25 MG PO CAPS: 50.0000 mg | ORAL_CAPSULE | Freq: Four times a day (QID) | ORAL | Status: DC | PRN

## 2019-08-27 ENCOUNTER — Ambulatory Visit (HOSPITAL_COMMUNITY)
Admission: RE | Admit: 2019-08-27 | Discharge: 2019-08-27 | Disposition: A | Discharge status: Home / Self Care | Payer: BC Managed Care – PPO | Source: Ambulatory Visit | Attending: Dermatology | Admitting: Dermatology

## 2019-08-27 ENCOUNTER — Other Ambulatory Visit: Payer: Self-pay

## 2019-08-27 DIAGNOSIS — L732 Hidradenitis suppurativa: Secondary | ICD-10-CM | POA: Insufficient documentation

## 2019-08-27 MED ORDER — SODIUM CHLORIDE 0.9 % IV SOLN: INTRAVENOUS | Status: DC

## 2019-08-27 MED ORDER — SODIUM CHLORIDE 0.9 % IV SOLN
10.0000 mg/kg | INTRAVENOUS | Status: DC
  Administered 2019-08-27: 600 mg via INTRAVENOUS
  Filled 2019-08-27: qty 60

## 2019-08-27 MED ORDER — ACETAMINOPHEN 325 MG PO TABS: 650.0000 mg | ORAL_TABLET | Freq: Four times a day (QID) | ORAL | Status: DC | PRN

## 2019-08-27 MED ORDER — DIPHENHYDRAMINE HCL 25 MG PO CAPS: 50.0000 mg | ORAL_CAPSULE | Freq: Four times a day (QID) | ORAL | Status: DC | PRN

## 2019-09-24 ENCOUNTER — Other Ambulatory Visit: Payer: Self-pay

## 2019-09-24 ENCOUNTER — Ambulatory Visit (HOSPITAL_COMMUNITY)
Admission: RE | Admit: 2019-09-24 | Discharge: 2019-09-24 | Disposition: A | Discharge status: Home / Self Care | Payer: BC Managed Care – PPO | Source: Ambulatory Visit | Attending: Dermatology | Admitting: Dermatology

## 2019-09-24 DIAGNOSIS — L732 Hidradenitis suppurativa: Secondary | ICD-10-CM | POA: Insufficient documentation

## 2019-09-24 MED ORDER — ACETAMINOPHEN 325 MG PO TABS: 650.0000 mg | ORAL_TABLET | Freq: Four times a day (QID) | ORAL | Status: DC | PRN

## 2019-09-24 MED ORDER — DIPHENHYDRAMINE HCL 25 MG PO CAPS: 50.0000 mg | ORAL_CAPSULE | Freq: Four times a day (QID) | ORAL | Status: DC | PRN

## 2019-09-24 MED ORDER — SODIUM CHLORIDE 0.9 % IV SOLN
10.0000 mg/kg | INTRAVENOUS | Status: DC
  Administered 2019-09-24: 600 mg via INTRAVENOUS
  Filled 2019-09-24: qty 60

## 2019-09-24 MED ORDER — SODIUM CHLORIDE 0.9 % IV SOLN: INTRAVENOUS | Status: DC

## 2019-10-22 ENCOUNTER — Other Ambulatory Visit: Payer: Self-pay

## 2019-10-22 ENCOUNTER — Ambulatory Visit (HOSPITAL_COMMUNITY)
Admission: RE | Admit: 2019-10-22 | Discharge: 2019-10-22 | Disposition: A | Discharge status: Home / Self Care | Payer: BC Managed Care – PPO | Source: Ambulatory Visit | Attending: Dermatology | Admitting: Dermatology

## 2019-10-22 DIAGNOSIS — L732 Hidradenitis suppurativa: Secondary | ICD-10-CM | POA: Insufficient documentation

## 2019-10-22 MED ORDER — DIPHENHYDRAMINE HCL 25 MG PO CAPS: 50.0000 mg | ORAL_CAPSULE | Freq: Four times a day (QID) | ORAL | Status: DC | PRN

## 2019-10-22 MED ORDER — SODIUM CHLORIDE 0.9 % IV SOLN
10.0000 mg/kg | INTRAVENOUS | Status: DC
  Administered 2019-10-22: 600 mg via INTRAVENOUS
  Filled 2019-10-22: qty 60

## 2019-10-22 MED ORDER — ACETAMINOPHEN 325 MG PO TABS: 650.0000 mg | ORAL_TABLET | Freq: Four times a day (QID) | ORAL | Status: DC | PRN

## 2019-10-22 MED ORDER — SODIUM CHLORIDE 0.9 % IV SOLN: INTRAVENOUS | Status: DC

## 2019-11-16 ENCOUNTER — Other Ambulatory Visit (HOSPITAL_COMMUNITY): Payer: Self-pay

## 2019-11-19 ENCOUNTER — Encounter (HOSPITAL_COMMUNITY)
Admission: RE | Admit: 2019-11-19 | Discharge: 2019-11-19 | Disposition: A | Discharge status: Home / Self Care | Payer: BC Managed Care – PPO | Source: Ambulatory Visit | Attending: Dermatology | Admitting: Dermatology

## 2019-11-19 ENCOUNTER — Other Ambulatory Visit: Payer: Self-pay

## 2019-11-19 DIAGNOSIS — L732 Hidradenitis suppurativa: Secondary | ICD-10-CM | POA: Diagnosis present

## 2019-11-19 MED ORDER — SODIUM CHLORIDE 0.9 % IV SOLN
10.0000 mg/kg | INTRAVENOUS | Status: DC
  Administered 2019-11-19: 600 mg via INTRAVENOUS
  Filled 2019-11-19: qty 60

## 2019-11-19 MED ORDER — DIPHENHYDRAMINE HCL 25 MG PO CAPS: 50.0000 mg | ORAL_CAPSULE | Freq: Once | ORAL | Status: DC

## 2019-11-19 MED ORDER — ACETAMINOPHEN 325 MG PO TABS: 650.0000 mg | ORAL_TABLET | Freq: Once | ORAL | Status: DC

## 2019-12-17 ENCOUNTER — Ambulatory Visit (HOSPITAL_COMMUNITY)
Admission: RE | Admit: 2019-12-17 | Discharge: 2019-12-17 | Disposition: A | Discharge status: Home / Self Care | Payer: BC Managed Care – PPO | Source: Ambulatory Visit | Attending: Dermatology | Admitting: Dermatology

## 2019-12-17 ENCOUNTER — Other Ambulatory Visit: Payer: Self-pay

## 2019-12-17 DIAGNOSIS — L732 Hidradenitis suppurativa: Secondary | ICD-10-CM | POA: Diagnosis present

## 2019-12-17 MED ORDER — DIPHENHYDRAMINE HCL 25 MG PO CAPS: 50.0000 mg | ORAL_CAPSULE | Freq: Once | ORAL | Status: DC

## 2019-12-17 MED ORDER — SODIUM CHLORIDE 0.9 % IV SOLN
10.0000 mg/kg | INTRAVENOUS | Status: DC
  Administered 2019-12-17: 600 mg via INTRAVENOUS
  Filled 2019-12-17: qty 60

## 2019-12-17 MED ORDER — ACETAMINOPHEN 325 MG PO TABS: 650.0000 mg | ORAL_TABLET | Freq: Once | ORAL | Status: DC

## 2020-01-14 ENCOUNTER — Ambulatory Visit (HOSPITAL_COMMUNITY)
Admission: RE | Admit: 2020-01-14 | Discharge: 2020-01-14 | Disposition: A | Discharge status: Home / Self Care | Payer: BC Managed Care – PPO | Source: Ambulatory Visit | Attending: Dermatology | Admitting: Dermatology

## 2020-01-14 ENCOUNTER — Other Ambulatory Visit: Payer: Self-pay

## 2020-01-14 NOTE — Progress Notes (Signed)
Called and spoke with Merrilee Seashore at [facility]. Needing orders for Remicade.  Patient to be rescheduled.

## 2020-01-24 ENCOUNTER — Other Ambulatory Visit (HOSPITAL_COMMUNITY): Payer: Self-pay

## 2020-01-25 ENCOUNTER — Ambulatory Visit (HOSPITAL_COMMUNITY)
Admission: RE | Admit: 2020-01-25 | Discharge: 2020-01-25 | Disposition: A | Discharge status: Home / Self Care | Payer: BC Managed Care – PPO | Source: Ambulatory Visit | Attending: Dermatology | Admitting: Dermatology

## 2020-01-25 ENCOUNTER — Other Ambulatory Visit: Payer: Self-pay

## 2020-01-25 DIAGNOSIS — L732 Hidradenitis suppurativa: Secondary | ICD-10-CM | POA: Insufficient documentation

## 2020-01-25 MED ORDER — ACETAMINOPHEN 325 MG PO TABS: 650.0000 mg | ORAL_TABLET | Freq: Once | ORAL | Status: DC

## 2020-01-25 MED ORDER — DIPHENHYDRAMINE HCL 25 MG PO CAPS: 50.0000 mg | ORAL_CAPSULE | Freq: Once | ORAL | Status: DC

## 2020-01-25 MED ORDER — SODIUM CHLORIDE 0.9 % IV SOLN
10.0000 mg/kg | INTRAVENOUS | Status: DC
  Administered 2020-01-25: 600 mg via INTRAVENOUS
  Filled 2020-01-25: qty 60

## 2020-02-11 ENCOUNTER — Encounter (HOSPITAL_COMMUNITY): Payer: BC Managed Care – PPO

## 2020-02-22 ENCOUNTER — Ambulatory Visit (HOSPITAL_COMMUNITY)
Admission: RE | Admit: 2020-02-22 | Discharge: 2020-02-22 | Disposition: A | Discharge status: Home / Self Care | Payer: BC Managed Care – PPO | Source: Ambulatory Visit | Attending: Dermatology | Admitting: Dermatology

## 2020-02-22 ENCOUNTER — Other Ambulatory Visit: Payer: Self-pay

## 2020-02-22 DIAGNOSIS — L732 Hidradenitis suppurativa: Secondary | ICD-10-CM | POA: Diagnosis present

## 2020-02-22 MED ORDER — DIPHENHYDRAMINE HCL 50 MG/ML IJ SOLN: 50.0000 mg | Freq: Once | INTRAMUSCULAR | Status: DC

## 2020-02-22 MED ORDER — ACETAMINOPHEN 325 MG PO TABS: 650.0000 mg | ORAL_TABLET | Freq: Once | ORAL | Status: DC

## 2020-02-22 MED ORDER — SODIUM CHLORIDE 0.9 % IV SOLN
10.0000 mg/kg | INTRAVENOUS | Status: DC
  Administered 2020-02-22: 600 mg via INTRAVENOUS
  Filled 2020-02-22: qty 60

## 2020-03-10 ENCOUNTER — Ambulatory Visit (HOSPITAL_COMMUNITY): Payer: BC Managed Care – PPO

## 2020-03-21 ENCOUNTER — Other Ambulatory Visit: Payer: Self-pay

## 2020-03-21 ENCOUNTER — Ambulatory Visit (HOSPITAL_COMMUNITY)
Admission: RE | Admit: 2020-03-21 | Discharge: 2020-03-21 | Disposition: A | Discharge status: Home / Self Care | Payer: BC Managed Care – PPO | Source: Ambulatory Visit | Attending: Dermatology | Admitting: Dermatology

## 2020-03-21 DIAGNOSIS — L732 Hidradenitis suppurativa: Secondary | ICD-10-CM | POA: Diagnosis present

## 2020-03-21 MED ORDER — ACETAMINOPHEN 325 MG PO TABS: 650.0000 mg | ORAL_TABLET | Freq: Once | ORAL | Status: DC

## 2020-03-21 MED ORDER — DIPHENHYDRAMINE HCL 50 MG/ML IJ SOLN: 50.0000 mg | Freq: Once | INTRAMUSCULAR | Status: DC

## 2020-03-21 MED ORDER — SODIUM CHLORIDE 0.9 % IV SOLN
10.0000 mg/kg | INTRAVENOUS | Status: DC
  Administered 2020-03-21: 600 mg via INTRAVENOUS
  Filled 2020-03-21 (×2): qty 60

## 2020-04-18 ENCOUNTER — Other Ambulatory Visit: Payer: Self-pay

## 2020-04-18 ENCOUNTER — Ambulatory Visit (HOSPITAL_COMMUNITY)
Admission: RE | Admit: 2020-04-18 | Discharge: 2020-04-18 | Disposition: A | Discharge status: Home / Self Care | Payer: BC Managed Care – PPO | Source: Ambulatory Visit | Attending: Dermatology | Admitting: Dermatology

## 2020-04-18 DIAGNOSIS — L732 Hidradenitis suppurativa: Secondary | ICD-10-CM | POA: Diagnosis present

## 2020-04-18 MED ORDER — ACETAMINOPHEN 325 MG PO TABS: 650.0000 mg | ORAL_TABLET | Freq: Once | ORAL | Status: DC

## 2020-04-18 MED ORDER — SODIUM CHLORIDE 0.9 % IV SOLN
10.0000 mg/kg | INTRAVENOUS | Status: DC
  Administered 2020-04-18: 600 mg via INTRAVENOUS
  Filled 2020-04-18: qty 60

## 2020-04-18 MED ORDER — DIPHENHYDRAMINE HCL 50 MG/ML IJ SOLN: 50.0000 mg | Freq: Once | INTRAMUSCULAR | Status: DC

## 2020-05-16 ENCOUNTER — Other Ambulatory Visit: Payer: Self-pay

## 2020-05-16 ENCOUNTER — Ambulatory Visit (HOSPITAL_COMMUNITY)
Admission: RE | Admit: 2020-05-16 | Discharge: 2020-05-16 | Disposition: A | Discharge status: Home / Self Care | Payer: BC Managed Care – PPO | Source: Ambulatory Visit | Attending: Dermatology | Admitting: Dermatology

## 2020-05-16 DIAGNOSIS — L732 Hidradenitis suppurativa: Secondary | ICD-10-CM | POA: Diagnosis present

## 2020-05-16 MED ORDER — ACETAMINOPHEN 325 MG PO TABS: 650.0000 mg | ORAL_TABLET | Freq: Once | ORAL | Status: DC

## 2020-05-16 MED ORDER — DIPHENHYDRAMINE HCL 50 MG/ML IJ SOLN: 50.0000 mg | Freq: Once | INTRAMUSCULAR | Status: DC

## 2020-05-16 MED ORDER — SODIUM CHLORIDE 0.9 % IV SOLN
600.0000 mg | INTRAVENOUS | Status: DC
  Administered 2020-05-16: 600 mg via INTRAVENOUS
  Filled 2020-05-16: qty 60

## 2020-06-13 ENCOUNTER — Other Ambulatory Visit: Payer: Self-pay

## 2020-06-13 ENCOUNTER — Ambulatory Visit (HOSPITAL_COMMUNITY)
Admission: RE | Admit: 2020-06-13 | Discharge: 2020-06-13 | Disposition: A | Discharge status: Home / Self Care | Payer: BC Managed Care – PPO | Source: Ambulatory Visit | Attending: Dermatology | Admitting: Dermatology

## 2020-06-13 DIAGNOSIS — L732 Hidradenitis suppurativa: Secondary | ICD-10-CM | POA: Insufficient documentation

## 2020-06-13 MED ORDER — DIPHENHYDRAMINE HCL 50 MG/ML IJ SOLN: 50.0000 mg | Freq: Once | INTRAMUSCULAR | Status: DC

## 2020-06-13 MED ORDER — SODIUM CHLORIDE 0.9 % IV SOLN
600.0000 mg | INTRAVENOUS | Status: DC
  Administered 2020-06-13: 600 mg via INTRAVENOUS
  Filled 2020-06-13: qty 60

## 2020-06-13 MED ORDER — ACETAMINOPHEN 325 MG PO TABS: 650.0000 mg | ORAL_TABLET | Freq: Once | ORAL | Status: DC

## 2020-07-11 ENCOUNTER — Other Ambulatory Visit: Payer: Self-pay

## 2020-07-11 ENCOUNTER — Ambulatory Visit (HOSPITAL_COMMUNITY)
Admission: RE | Admit: 2020-07-11 | Discharge: 2020-07-11 | Disposition: A | Discharge status: Home / Self Care | Payer: BC Managed Care – PPO | Source: Ambulatory Visit | Attending: Dermatology | Admitting: Dermatology

## 2020-07-11 DIAGNOSIS — L732 Hidradenitis suppurativa: Secondary | ICD-10-CM | POA: Insufficient documentation

## 2020-07-11 MED ORDER — SODIUM CHLORIDE 0.9 % IV SOLN
600.0000 mg | INTRAVENOUS | Status: DC
  Administered 2020-07-11: 600 mg via INTRAVENOUS
  Filled 2020-07-11: qty 60

## 2020-07-11 MED ORDER — ACETAMINOPHEN 325 MG PO TABS: 650.0000 mg | ORAL_TABLET | Freq: Once | ORAL | Status: DC

## 2020-07-11 MED ORDER — DIPHENHYDRAMINE HCL 50 MG/ML IJ SOLN: 50.0000 mg | Freq: Once | INTRAMUSCULAR | Status: DC

## 2020-08-08 ENCOUNTER — Other Ambulatory Visit: Payer: Self-pay

## 2020-08-08 ENCOUNTER — Ambulatory Visit (HOSPITAL_COMMUNITY)
Admission: RE | Admit: 2020-08-08 | Discharge: 2020-08-08 | Disposition: A | Discharge status: Home / Self Care | Payer: Medicaid Other | Source: Ambulatory Visit | Attending: Dermatology | Admitting: Dermatology

## 2020-08-08 DIAGNOSIS — L732 Hidradenitis suppurativa: Secondary | ICD-10-CM | POA: Insufficient documentation

## 2020-08-08 MED ORDER — DIPHENHYDRAMINE HCL 50 MG/ML IJ SOLN: 50.0000 mg | Freq: Once | INTRAMUSCULAR | Status: DC

## 2020-08-08 MED ORDER — ACETAMINOPHEN 325 MG PO TABS: 650.0000 mg | ORAL_TABLET | Freq: Once | ORAL | Status: DC

## 2020-08-08 MED ORDER — SODIUM CHLORIDE 0.9 % IV SOLN
600.0000 mg | INTRAVENOUS | Status: DC
  Administered 2020-08-08: 600 mg via INTRAVENOUS
  Filled 2020-08-08: qty 60

## 2020-09-05 ENCOUNTER — Ambulatory Visit (HOSPITAL_COMMUNITY)
Admission: RE | Admit: 2020-09-05 | Discharge: 2020-09-05 | Disposition: A | Discharge status: Home / Self Care | Payer: Medicaid Other | Source: Ambulatory Visit | Attending: Dermatology | Admitting: Dermatology

## 2020-09-05 ENCOUNTER — Other Ambulatory Visit: Payer: Self-pay

## 2020-09-05 DIAGNOSIS — L732 Hidradenitis suppurativa: Secondary | ICD-10-CM | POA: Insufficient documentation

## 2020-09-05 MED ORDER — SODIUM CHLORIDE 0.9 % IV SOLN
600.0000 mg | INTRAVENOUS | Status: DC
  Administered 2020-09-05: 600 mg via INTRAVENOUS
  Filled 2020-09-05: qty 60

## 2020-09-05 MED ORDER — DIPHENHYDRAMINE HCL 50 MG/ML IJ SOLN: 50.0000 mg | Freq: Once | INTRAMUSCULAR | Status: DC

## 2020-09-05 MED ORDER — ACETAMINOPHEN 325 MG PO TABS: 650.0000 mg | ORAL_TABLET | Freq: Once | ORAL | Status: DC

## 2020-10-03 ENCOUNTER — Ambulatory Visit (HOSPITAL_COMMUNITY)
Admission: RE | Admit: 2020-10-03 | Discharge: 2020-10-03 | Disposition: A | Discharge status: Home / Self Care | Payer: Medicaid Other | Source: Ambulatory Visit | Attending: Dermatology | Admitting: Dermatology

## 2020-10-03 ENCOUNTER — Other Ambulatory Visit: Payer: Self-pay

## 2020-10-03 DIAGNOSIS — L732 Hidradenitis suppurativa: Secondary | ICD-10-CM | POA: Insufficient documentation

## 2020-10-03 MED ORDER — SODIUM CHLORIDE 0.9 % IV SOLN
600.0000 mg | INTRAVENOUS | Status: DC
  Administered 2020-10-03: 600 mg via INTRAVENOUS
  Filled 2020-10-03: qty 60

## 2020-10-03 MED ORDER — DIPHENHYDRAMINE HCL 50 MG/ML IJ SOLN: 50.0000 mg | Freq: Once | INTRAMUSCULAR | Status: DC

## 2020-10-03 MED ORDER — ACETAMINOPHEN 325 MG PO TABS: 650.0000 mg | ORAL_TABLET | Freq: Once | ORAL | Status: DC

## 2020-10-31 ENCOUNTER — Ambulatory Visit (HOSPITAL_COMMUNITY)
Admission: RE | Admit: 2020-10-31 | Discharge: 2020-10-31 | Disposition: A | Discharge status: Home / Self Care | Payer: Managed Care, Other (non HMO) | Source: Ambulatory Visit | Attending: Dermatology | Admitting: Dermatology

## 2020-10-31 ENCOUNTER — Other Ambulatory Visit: Payer: Self-pay

## 2020-10-31 DIAGNOSIS — L732 Hidradenitis suppurativa: Secondary | ICD-10-CM | POA: Diagnosis not present

## 2020-10-31 MED ORDER — SODIUM CHLORIDE 0.9 % IV SOLN
600.0000 mg | INTRAVENOUS | Status: DC
  Administered 2020-10-31: 600 mg via INTRAVENOUS
  Filled 2020-10-31: qty 60

## 2020-10-31 MED ORDER — ACETAMINOPHEN 325 MG PO TABS: 650.0000 mg | ORAL_TABLET | Freq: Once | ORAL | Status: DC

## 2020-10-31 MED ORDER — DIPHENHYDRAMINE HCL 50 MG/ML IJ SOLN: 50.0000 mg | Freq: Once | INTRAMUSCULAR | Status: DC

## 2020-11-28 ENCOUNTER — Other Ambulatory Visit: Payer: Self-pay

## 2020-11-28 ENCOUNTER — Ambulatory Visit (HOSPITAL_COMMUNITY)
Admission: RE | Admit: 2020-11-28 | Discharge: 2020-11-28 | Disposition: A | Discharge status: Home / Self Care | Payer: Managed Care, Other (non HMO) | Source: Ambulatory Visit | Attending: Dermatology | Admitting: Dermatology

## 2020-11-28 DIAGNOSIS — L732 Hidradenitis suppurativa: Secondary | ICD-10-CM | POA: Diagnosis not present

## 2020-11-28 MED ORDER — SODIUM CHLORIDE 0.9 % IV SOLN
600.0000 mg | INTRAVENOUS | Status: DC
  Administered 2020-11-28: 600 mg via INTRAVENOUS
  Filled 2020-11-28: qty 60

## 2020-11-28 MED ORDER — DIPHENHYDRAMINE HCL 50 MG/ML IJ SOLN: 50.0000 mg | Freq: Once | INTRAMUSCULAR | Status: DC

## 2020-11-28 MED ORDER — ACETAMINOPHEN 325 MG PO TABS: 650.0000 mg | ORAL_TABLET | Freq: Once | ORAL | Status: DC

## 2020-12-26 ENCOUNTER — Ambulatory Visit (HOSPITAL_COMMUNITY)
Admission: RE | Admit: 2020-12-26 | Discharge: 2020-12-26 | Disposition: A | Discharge status: Home / Self Care | Payer: Managed Care, Other (non HMO) | Source: Ambulatory Visit | Attending: Dermatology | Admitting: Dermatology

## 2020-12-26 DIAGNOSIS — L732 Hidradenitis suppurativa: Secondary | ICD-10-CM | POA: Insufficient documentation

## 2020-12-26 MED ORDER — SODIUM CHLORIDE 0.9 % IV SOLN
600.0000 mg | INTRAVENOUS | Status: DC
  Administered 2020-12-26: 600 mg via INTRAVENOUS
  Filled 2020-12-26: qty 60

## 2020-12-26 MED ORDER — DIPHENHYDRAMINE HCL 50 MG/ML IJ SOLN: 50.0000 mg | Freq: Once | INTRAMUSCULAR | Status: DC

## 2020-12-26 MED ORDER — ACETAMINOPHEN 325 MG PO TABS: 650.0000 mg | ORAL_TABLET | Freq: Once | ORAL | Status: DC

## 2021-01-23 ENCOUNTER — Other Ambulatory Visit: Payer: Self-pay

## 2021-01-23 ENCOUNTER — Ambulatory Visit (HOSPITAL_COMMUNITY)
Admission: RE | Admit: 2021-01-23 | Discharge: 2021-01-23 | Disposition: A | Discharge status: Home / Self Care | Payer: Managed Care, Other (non HMO) | Source: Ambulatory Visit | Attending: Dermatology | Admitting: Dermatology

## 2021-01-23 DIAGNOSIS — L732 Hidradenitis suppurativa: Secondary | ICD-10-CM | POA: Diagnosis not present

## 2021-01-23 MED ORDER — DIPHENHYDRAMINE HCL 50 MG/ML IJ SOLN: 50.0000 mg | Freq: Once | INTRAMUSCULAR | Status: DC

## 2021-01-23 MED ORDER — SODIUM CHLORIDE 0.9 % IV SOLN
600.0000 mg | INTRAVENOUS | Status: DC
  Administered 2021-01-23: 600 mg via INTRAVENOUS
  Filled 2021-01-23: qty 60

## 2021-01-23 MED ORDER — ACETAMINOPHEN 325 MG PO TABS: 650.0000 mg | ORAL_TABLET | Freq: Once | ORAL | Status: DC

## 2021-02-20 ENCOUNTER — Ambulatory Visit (HOSPITAL_COMMUNITY)
Admission: RE | Admit: 2021-02-20 | Discharge: 2021-02-20 | Disposition: A | Discharge status: Home / Self Care | Payer: Managed Care, Other (non HMO) | Source: Ambulatory Visit | Attending: Dermatology | Admitting: Dermatology

## 2021-02-20 ENCOUNTER — Other Ambulatory Visit: Payer: Self-pay

## 2021-02-20 DIAGNOSIS — L732 Hidradenitis suppurativa: Secondary | ICD-10-CM | POA: Diagnosis present

## 2021-02-20 MED ORDER — ACETAMINOPHEN 325 MG PO TABS: 650.0000 mg | ORAL_TABLET | Freq: Once | ORAL | Status: DC

## 2021-02-20 MED ORDER — DIPHENHYDRAMINE HCL 25 MG PO CAPS: 50.0000 mg | ORAL_CAPSULE | Freq: Once | ORAL | Status: DC

## 2021-02-20 MED ORDER — SODIUM CHLORIDE 0.9 % IV SOLN
600.0000 mg | INTRAVENOUS | Status: DC
  Administered 2021-02-20: 600 mg via INTRAVENOUS
  Filled 2021-02-20: qty 60

## 2021-03-20 ENCOUNTER — Inpatient Hospital Stay (HOSPITAL_COMMUNITY)
Admission: RE | Admit: 2021-03-20 | Discharge: 2021-03-20 | Disposition: A | Discharge status: Home / Self Care | Payer: 59 | Source: Ambulatory Visit | Attending: Dermatology | Admitting: Dermatology

## 2021-03-25 ENCOUNTER — Encounter (HOSPITAL_COMMUNITY): Payer: 59

## 2021-04-01 ENCOUNTER — Encounter (HOSPITAL_COMMUNITY): Payer: 59

## 2021-04-10 ENCOUNTER — Ambulatory Visit (HOSPITAL_COMMUNITY)
Admission: RE | Admit: 2021-04-10 | Discharge: 2021-04-10 | Disposition: A | Discharge status: Home / Self Care | Payer: Managed Care, Other (non HMO) | Source: Ambulatory Visit | Attending: Dermatology | Admitting: Dermatology

## 2021-04-10 ENCOUNTER — Other Ambulatory Visit: Payer: Self-pay

## 2021-04-10 DIAGNOSIS — L732 Hidradenitis suppurativa: Secondary | ICD-10-CM | POA: Diagnosis present

## 2021-04-10 MED ORDER — SODIUM CHLORIDE 0.9 % IV SOLN
600.0000 mg | INTRAVENOUS | Status: DC
  Administered 2021-04-10: 600 mg via INTRAVENOUS
  Filled 2021-04-10: qty 60

## 2021-04-10 MED ORDER — DIPHENHYDRAMINE HCL 25 MG PO CAPS: 50.0000 mg | ORAL_CAPSULE | Freq: Once | ORAL | Status: DC

## 2021-04-10 MED ORDER — ACETAMINOPHEN 325 MG PO TABS: 650.0000 mg | ORAL_TABLET | Freq: Once | ORAL | Status: DC

## 2021-04-17 ENCOUNTER — Ambulatory Visit (HOSPITAL_COMMUNITY): Payer: 59

## 2021-04-22 ENCOUNTER — Encounter (HOSPITAL_COMMUNITY): Payer: 59

## 2021-05-08 ENCOUNTER — Encounter (HOSPITAL_COMMUNITY)
Admission: RE | Admit: 2021-05-08 | Discharge: 2021-05-08 | Disposition: A | Discharge status: Home / Self Care | Payer: Managed Care, Other (non HMO) | Source: Ambulatory Visit | Attending: Dermatology | Admitting: Dermatology

## 2021-05-08 DIAGNOSIS — L732 Hidradenitis suppurativa: Secondary | ICD-10-CM | POA: Diagnosis present

## 2021-05-08 MED ORDER — ACETAMINOPHEN 325 MG PO TABS: 650.0000 mg | ORAL_TABLET | Freq: Once | ORAL | Status: DC

## 2021-05-08 MED ORDER — SODIUM CHLORIDE 0.9 % IV SOLN
600.0000 mg | INTRAVENOUS | Status: DC
  Administered 2021-05-08: 600 mg via INTRAVENOUS
  Filled 2021-05-08: qty 60

## 2021-05-08 MED ORDER — DIPHENHYDRAMINE HCL 25 MG PO CAPS: 50.0000 mg | ORAL_CAPSULE | Freq: Once | ORAL | Status: DC

## 2021-06-05 ENCOUNTER — Encounter (HOSPITAL_COMMUNITY)
Admission: RE | Admit: 2021-06-05 | Discharge: 2021-06-05 | Disposition: A | Discharge status: Home / Self Care | Payer: Managed Care, Other (non HMO) | Source: Ambulatory Visit | Attending: Dermatology | Admitting: Dermatology

## 2021-06-05 DIAGNOSIS — L732 Hidradenitis suppurativa: Secondary | ICD-10-CM | POA: Diagnosis not present

## 2021-06-05 MED ORDER — SODIUM CHLORIDE 0.9 % IV SOLN
600.0000 mg | INTRAVENOUS | Status: DC
  Administered 2021-06-05: 600 mg via INTRAVENOUS
  Filled 2021-06-05: qty 60

## 2021-06-05 MED ORDER — DIPHENHYDRAMINE HCL 25 MG PO CAPS: 50.0000 mg | ORAL_CAPSULE | Freq: Once | ORAL | Status: DC

## 2021-06-05 MED ORDER — ACETAMINOPHEN 325 MG PO TABS: 650.0000 mg | ORAL_TABLET | Freq: Once | ORAL | Status: DC

## 2021-07-03 ENCOUNTER — Ambulatory Visit (HOSPITAL_COMMUNITY): Payer: 59

## 2021-07-03 ENCOUNTER — Other Ambulatory Visit (HOSPITAL_COMMUNITY): Payer: Self-pay

## 2021-07-06 ENCOUNTER — Encounter (HOSPITAL_COMMUNITY)
Admission: RE | Admit: 2021-07-06 | Discharge: 2021-07-06 | Disposition: A | Discharge status: Home / Self Care | Payer: Managed Care, Other (non HMO) | Source: Ambulatory Visit | Attending: Dermatology | Admitting: Dermatology

## 2021-07-06 DIAGNOSIS — L732 Hidradenitis suppurativa: Secondary | ICD-10-CM | POA: Diagnosis not present

## 2021-07-06 MED ORDER — SODIUM CHLORIDE 0.9 % IV SOLN
600.0000 mg | INTRAVENOUS | Status: DC
  Administered 2021-07-06: 600 mg via INTRAVENOUS
  Filled 2021-07-06: qty 60

## 2021-07-06 MED ORDER — ACETAMINOPHEN 325 MG PO TABS: 650.0000 mg | ORAL_TABLET | Freq: Once | ORAL | Status: DC

## 2021-07-06 MED ORDER — DIPHENHYDRAMINE HCL 25 MG PO CAPS: 50.0000 mg | ORAL_CAPSULE | Freq: Once | ORAL | Status: DC

## 2021-07-13 ENCOUNTER — Ambulatory Visit (HOSPITAL_COMMUNITY): Payer: 59

## 2021-08-04 ENCOUNTER — Ambulatory Visit (HOSPITAL_COMMUNITY): Payer: 59

## 2021-08-12 ENCOUNTER — Ambulatory Visit (HOSPITAL_COMMUNITY)
Admission: RE | Admit: 2021-08-12 | Discharge: 2021-08-12 | Disposition: A | Discharge status: Home / Self Care | Payer: 59 | Source: Ambulatory Visit | Attending: Dermatology | Admitting: Dermatology

## 2021-08-12 VITALS — BP 121/76 | HR 63 | Temp 98.2°F | Resp 18 | Wt 140.0 lb

## 2021-08-12 DIAGNOSIS — E222 Syndrome of inappropriate secretion of antidiuretic hormone: Secondary | ICD-10-CM | POA: Insufficient documentation

## 2021-08-12 MED ORDER — SODIUM CHLORIDE 0.9 % IV SOLN
600.0000 mg | INTRAVENOUS | Status: DC
  Administered 2021-08-12: 600 mg via INTRAVENOUS
  Filled 2021-08-12: qty 60

## 2021-08-12 MED ORDER — DIPHENHYDRAMINE HCL 25 MG PO CAPS: 50.0000 mg | ORAL_CAPSULE | Freq: Once | ORAL | Status: DC

## 2021-08-12 MED ORDER — ACETAMINOPHEN 325 MG PO TABS: 650.0000 mg | ORAL_TABLET | Freq: Once | ORAL | Status: DC

## 2021-08-15 LAB — QUANTIFERON-TB GOLD PLUS (RQFGPL)
QuantiFERON Mitogen Value: 7.17 IU/mL
QuantiFERON Nil Value: 0.04 IU/mL
QuantiFERON TB1 Ag Value: 0.02 IU/mL
QuantiFERON TB2 Ag Value: 0.05 IU/mL

## 2021-08-15 LAB — QUANTIFERON-TB GOLD PLUS: QuantiFERON-TB Gold Plus: NEGATIVE

## 2021-08-17 ENCOUNTER — Ambulatory Visit (HOSPITAL_COMMUNITY): Payer: 59

## 2021-08-31 ENCOUNTER — Ambulatory Visit (HOSPITAL_COMMUNITY): Payer: 59

## 2021-09-09 ENCOUNTER — Inpatient Hospital Stay (HOSPITAL_COMMUNITY): Admission: RE | Admit: 2021-09-09 | Payer: 59 | Source: Ambulatory Visit

## 2021-10-07 ENCOUNTER — Encounter (HOSPITAL_COMMUNITY): Payer: 59

## 2021-10-16 ENCOUNTER — Ambulatory Visit (HOSPITAL_COMMUNITY)
Admission: RE | Admit: 2021-10-16 | Discharge: 2021-10-16 | Disposition: A | Discharge status: Home / Self Care | Payer: Managed Care, Other (non HMO) | Source: Ambulatory Visit | Attending: Dermatology | Admitting: Dermatology

## 2021-10-16 DIAGNOSIS — L732 Hidradenitis suppurativa: Secondary | ICD-10-CM | POA: Diagnosis not present

## 2021-10-16 MED ORDER — ACETAMINOPHEN 325 MG PO TABS: 650.0000 mg | ORAL_TABLET | Freq: Once | ORAL | Status: DC

## 2021-10-16 MED ORDER — DIPHENHYDRAMINE HCL 25 MG PO CAPS: 50.0000 mg | ORAL_CAPSULE | Freq: Once | ORAL | Status: DC

## 2021-10-16 MED ORDER — SODIUM CHLORIDE 0.9 % IV SOLN
600.0000 mg | INTRAVENOUS | Status: DC
  Administered 2021-10-16: 600 mg via INTRAVENOUS
  Filled 2021-10-16: qty 60

## 2021-11-12 ENCOUNTER — Other Ambulatory Visit (HOSPITAL_COMMUNITY): Payer: Self-pay | Admitting: *Deleted

## 2021-11-13 ENCOUNTER — Ambulatory Visit (HOSPITAL_COMMUNITY)
Admission: RE | Admit: 2021-11-13 | Discharge: 2021-11-13 | Disposition: A | Discharge status: Home / Self Care | Payer: Managed Care, Other (non HMO) | Source: Ambulatory Visit | Attending: Dermatology | Admitting: Dermatology

## 2021-11-13 DIAGNOSIS — L732 Hidradenitis suppurativa: Secondary | ICD-10-CM | POA: Diagnosis present

## 2021-11-13 MED ORDER — SODIUM CHLORIDE 0.9 % IV SOLN
600.0000 mg | INTRAVENOUS | Status: DC
  Administered 2021-11-13: 600 mg via INTRAVENOUS
  Filled 2021-11-13: qty 60

## 2021-11-13 MED ORDER — DIPHENHYDRAMINE HCL 25 MG PO CAPS: 50.0000 mg | ORAL_CAPSULE | Freq: Once | ORAL | Status: DC

## 2021-11-13 MED ORDER — ACETAMINOPHEN 325 MG PO TABS: 650.0000 mg | ORAL_TABLET | Freq: Once | ORAL | Status: DC

## 2021-12-11 ENCOUNTER — Ambulatory Visit (HOSPITAL_COMMUNITY)
Admission: RE | Admit: 2021-12-11 | Discharge: 2021-12-11 | Disposition: A | Discharge status: Home / Self Care | Payer: Managed Care, Other (non HMO) | Source: Ambulatory Visit | Attending: Dermatology | Admitting: Dermatology

## 2021-12-11 DIAGNOSIS — L732 Hidradenitis suppurativa: Secondary | ICD-10-CM | POA: Insufficient documentation

## 2021-12-11 MED ORDER — SODIUM CHLORIDE 0.9 % IV SOLN
600.0000 mg | INTRAVENOUS | Status: DC
  Administered 2021-12-11: 600 mg via INTRAVENOUS
  Filled 2021-12-11: qty 60

## 2021-12-11 MED ORDER — ACETAMINOPHEN 325 MG PO TABS: 650.0000 mg | ORAL_TABLET | Freq: Once | ORAL | Status: DC

## 2021-12-11 MED ORDER — DIPHENHYDRAMINE HCL 25 MG PO CAPS: 50.0000 mg | ORAL_CAPSULE | Freq: Once | ORAL | Status: DC

## 2022-01-08 ENCOUNTER — Ambulatory Visit (HOSPITAL_COMMUNITY)
Admission: RE | Admit: 2022-01-08 | Discharge: 2022-01-08 | Disposition: A | Discharge status: Home / Self Care | Payer: 59 | Source: Ambulatory Visit | Attending: Dermatology | Admitting: Dermatology

## 2022-01-08 DIAGNOSIS — L732 Hidradenitis suppurativa: Secondary | ICD-10-CM | POA: Diagnosis present

## 2022-01-08 MED ORDER — DIPHENHYDRAMINE HCL 25 MG PO CAPS: 50.0000 mg | ORAL_CAPSULE | Freq: Once | ORAL | Status: DC

## 2022-01-08 MED ORDER — ACETAMINOPHEN 325 MG PO TABS: 650.0000 mg | ORAL_TABLET | Freq: Once | ORAL | Status: DC

## 2022-01-08 MED ORDER — SODIUM CHLORIDE 0.9 % IV SOLN
600.0000 mg | INTRAVENOUS | Status: DC
  Administered 2022-01-08: 600 mg via INTRAVENOUS
  Filled 2022-01-08: qty 60

## 2022-02-05 ENCOUNTER — Encounter (HOSPITAL_COMMUNITY)
Admission: RE | Admit: 2022-02-05 | Discharge: 2022-02-05 | Disposition: A | Discharge status: Home / Self Care | Payer: 59 | Source: Ambulatory Visit | Attending: Dermatology | Admitting: Dermatology

## 2022-02-05 DIAGNOSIS — L080 Pyoderma: Secondary | ICD-10-CM | POA: Insufficient documentation

## 2022-02-05 MED ORDER — SODIUM CHLORIDE 0.9 % IV SOLN
10.0000 mg/kg | INTRAVENOUS | Status: DC
  Administered 2022-02-05: 600 mg via INTRAVENOUS
  Filled 2022-02-05: qty 60

## 2022-03-05 ENCOUNTER — Encounter (HOSPITAL_COMMUNITY)
Admission: RE | Admit: 2022-03-05 | Discharge: 2022-03-05 | Disposition: A | Discharge status: Home / Self Care | Payer: 59 | Source: Ambulatory Visit | Attending: Dermatology | Admitting: Dermatology

## 2022-03-05 DIAGNOSIS — L080 Pyoderma: Secondary | ICD-10-CM | POA: Diagnosis not present

## 2022-03-05 MED ORDER — DIPHENHYDRAMINE HCL 25 MG PO CAPS: 25.0000 mg | ORAL_CAPSULE | ORAL | Status: DC

## 2022-03-05 MED ORDER — ACETAMINOPHEN 325 MG PO TABS: 650.0000 mg | ORAL_TABLET | ORAL | Status: DC

## 2022-03-05 MED ORDER — SODIUM CHLORIDE 0.9 % IV SOLN
10.0000 mg/kg | INTRAVENOUS | Status: DC
  Administered 2022-03-05: 600 mg via INTRAVENOUS
  Filled 2022-03-05: qty 60

## 2022-04-02 ENCOUNTER — Ambulatory Visit (HOSPITAL_COMMUNITY)
Admission: RE | Admit: 2022-04-02 | Discharge: 2022-04-02 | Disposition: A | Discharge status: Home / Self Care | Payer: 59 | Source: Ambulatory Visit | Attending: Dermatology | Admitting: Dermatology

## 2022-04-02 DIAGNOSIS — L732 Hidradenitis suppurativa: Secondary | ICD-10-CM | POA: Diagnosis present

## 2022-04-02 MED ORDER — ACETAMINOPHEN 325 MG PO TABS: 650.0000 mg | ORAL_TABLET | ORAL | Status: DC

## 2022-04-02 MED ORDER — DIPHENHYDRAMINE HCL 25 MG PO CAPS: 25.0000 mg | ORAL_CAPSULE | ORAL | Status: DC

## 2022-04-02 MED ORDER — SODIUM CHLORIDE 0.9 % IV SOLN
10.0000 mg/kg | INTRAVENOUS | Status: DC
  Administered 2022-04-02: 600 mg via INTRAVENOUS
  Filled 2022-04-02: qty 60

## 2022-04-30 ENCOUNTER — Inpatient Hospital Stay (HOSPITAL_COMMUNITY): Admission: RE | Admit: 2022-04-30 | Payer: 59 | Source: Ambulatory Visit

## 2022-05-28 ENCOUNTER — Encounter (HOSPITAL_COMMUNITY): Payer: 59

## 2022-08-19 ENCOUNTER — Other Ambulatory Visit (HOSPITAL_COMMUNITY): Payer: Self-pay | Admitting: *Deleted

## 2022-08-19 DIAGNOSIS — E222 Syndrome of inappropriate secretion of antidiuretic hormone: Secondary | ICD-10-CM

## 2022-08-23 ENCOUNTER — Ambulatory Visit (HOSPITAL_COMMUNITY)
Admission: RE | Admit: 2022-08-23 | Discharge: 2022-08-23 | Disposition: A | Discharge status: Home / Self Care | Payer: Managed Care, Other (non HMO) | Source: Ambulatory Visit | Attending: Dermatology | Admitting: Dermatology

## 2022-08-23 DIAGNOSIS — E222 Syndrome of inappropriate secretion of antidiuretic hormone: Secondary | ICD-10-CM | POA: Insufficient documentation

## 2022-08-23 MED ORDER — DIPHENHYDRAMINE HCL 25 MG PO CAPS: 25.0000 mg | ORAL_CAPSULE | Freq: Once | ORAL | Status: DC

## 2022-08-23 MED ORDER — ACETAMINOPHEN 325 MG PO TABS: 650.0000 mg | ORAL_TABLET | Freq: Once | ORAL | Status: DC

## 2022-08-23 MED ORDER — SODIUM CHLORIDE 0.9 % IV SOLN
10.0000 mg/kg | INTRAVENOUS | Status: DC
  Administered 2022-08-23: 600 mg via INTRAVENOUS
  Filled 2022-08-23: qty 60

## 2022-09-20 ENCOUNTER — Inpatient Hospital Stay (HOSPITAL_COMMUNITY)
Admission: RE | Admit: 2022-09-20 | Discharge: 2022-09-20 | Disposition: A | Discharge status: Home / Self Care | Payer: 59 | Source: Ambulatory Visit | Attending: Dermatology | Admitting: Dermatology

## 2022-09-23 ENCOUNTER — Encounter (HOSPITAL_COMMUNITY)
Admission: RE | Admit: 2022-09-23 | Discharge: 2022-09-23 | Disposition: A | Discharge status: Home / Self Care | Payer: Managed Care, Other (non HMO) | Source: Ambulatory Visit | Attending: Dermatology | Admitting: Dermatology

## 2022-09-23 DIAGNOSIS — L732 Hidradenitis suppurativa: Secondary | ICD-10-CM | POA: Diagnosis present

## 2022-09-23 MED ORDER — SODIUM CHLORIDE 0.9 % IV SOLN
10.0000 mg/kg | INTRAVENOUS | Status: DC
  Administered 2022-09-23: 600 mg via INTRAVENOUS
  Filled 2022-09-23: qty 60

## 2022-09-23 MED ORDER — DIPHENHYDRAMINE HCL 25 MG PO CAPS: 25.0000 mg | ORAL_CAPSULE | Freq: Once | ORAL | Status: DC

## 2022-09-23 MED ORDER — ACETAMINOPHEN 325 MG PO TABS: 650.0000 mg | ORAL_TABLET | Freq: Once | ORAL | Status: DC

## 2022-10-18 ENCOUNTER — Encounter (HOSPITAL_COMMUNITY): Payer: 59

## 2022-10-25 ENCOUNTER — Ambulatory Visit (HOSPITAL_COMMUNITY)
Admission: RE | Admit: 2022-10-25 | Discharge: 2022-10-25 | Disposition: A | Discharge status: Home / Self Care | Payer: Managed Care, Other (non HMO) | Source: Ambulatory Visit | Attending: Dermatology | Admitting: Dermatology

## 2022-10-25 DIAGNOSIS — L732 Hidradenitis suppurativa: Secondary | ICD-10-CM | POA: Diagnosis present

## 2022-10-25 MED ORDER — DIPHENHYDRAMINE HCL 25 MG PO CAPS: 25.0000 mg | ORAL_CAPSULE | Freq: Once | ORAL | Status: DC

## 2022-10-25 MED ORDER — SODIUM CHLORIDE 0.9 % IV SOLN
10.0000 mg/kg | INTRAVENOUS | Status: DC
  Administered 2022-10-25: 700 mg via INTRAVENOUS
  Filled 2022-10-25: qty 70

## 2022-10-25 MED ORDER — ACETAMINOPHEN 325 MG PO TABS: 650.0000 mg | ORAL_TABLET | Freq: Once | ORAL | Status: DC

## 2022-11-22 ENCOUNTER — Ambulatory Visit (HOSPITAL_COMMUNITY)
Admission: RE | Admit: 2022-11-22 | Discharge: 2022-11-22 | Disposition: A | Discharge status: Home / Self Care | Payer: Managed Care, Other (non HMO) | Source: Ambulatory Visit | Attending: Dermatology | Admitting: Dermatology

## 2022-11-22 DIAGNOSIS — L732 Hidradenitis suppurativa: Secondary | ICD-10-CM | POA: Insufficient documentation

## 2022-11-22 MED ORDER — ACETAMINOPHEN 325 MG PO TABS: 650.0000 mg | ORAL_TABLET | Freq: Once | ORAL | Status: DC

## 2022-11-22 MED ORDER — SODIUM CHLORIDE 0.9 % IV SOLN
10.0000 mg/kg | INTRAVENOUS | Status: DC
  Administered 2022-11-22: 700 mg via INTRAVENOUS
  Filled 2022-11-22: qty 70

## 2022-11-22 MED ORDER — DIPHENHYDRAMINE HCL 25 MG PO CAPS: 25.0000 mg | ORAL_CAPSULE | Freq: Once | ORAL | Status: DC

## 2022-12-20 ENCOUNTER — Ambulatory Visit (HOSPITAL_COMMUNITY)
Admission: RE | Admit: 2022-12-20 | Discharge: 2022-12-20 | Disposition: A | Discharge status: Home / Self Care | Payer: Managed Care, Other (non HMO) | Source: Ambulatory Visit | Attending: Dermatology | Admitting: Dermatology

## 2022-12-20 DIAGNOSIS — L732 Hidradenitis suppurativa: Secondary | ICD-10-CM | POA: Insufficient documentation

## 2022-12-20 MED ORDER — ACETAMINOPHEN 325 MG PO TABS: 650.0000 mg | ORAL_TABLET | Freq: Once | ORAL | Status: DC

## 2022-12-20 MED ORDER — DIPHENHYDRAMINE HCL 25 MG PO CAPS: 25.0000 mg | ORAL_CAPSULE | Freq: Once | ORAL | Status: DC

## 2022-12-20 MED ORDER — SODIUM CHLORIDE 0.9 % IV SOLN
10.0000 mg/kg | INTRAVENOUS | Status: DC
  Administered 2022-12-20: 700 mg via INTRAVENOUS
  Filled 2022-12-20: qty 70

## 2023-01-17 ENCOUNTER — Encounter (HOSPITAL_COMMUNITY): Payer: 59

## 2023-01-18 ENCOUNTER — Inpatient Hospital Stay (HOSPITAL_COMMUNITY): Admission: RE | Admit: 2023-01-18 | Payer: 59 | Source: Ambulatory Visit

## 2023-01-21 ENCOUNTER — Ambulatory Visit (HOSPITAL_COMMUNITY)
Admission: RE | Admit: 2023-01-21 | Discharge: 2023-01-21 | Disposition: A | Discharge status: Home / Self Care | Payer: Managed Care, Other (non HMO) | Source: Ambulatory Visit | Attending: Dermatology | Admitting: Dermatology

## 2023-01-21 DIAGNOSIS — L732 Hidradenitis suppurativa: Secondary | ICD-10-CM | POA: Insufficient documentation

## 2023-01-21 MED ORDER — ACETAMINOPHEN 325 MG PO TABS: 650.0000 mg | ORAL_TABLET | Freq: Once | ORAL | Status: DC

## 2023-01-21 MED ORDER — SODIUM CHLORIDE 0.9 % IV SOLN
10.0000 mg/kg | INTRAVENOUS | Status: DC
  Administered 2023-01-21: 700 mg via INTRAVENOUS
  Filled 2023-01-21: qty 70

## 2023-01-21 MED ORDER — DIPHENHYDRAMINE HCL 25 MG PO CAPS: 25.0000 mg | ORAL_CAPSULE | Freq: Once | ORAL | Status: DC

## 2023-02-14 ENCOUNTER — Encounter (HOSPITAL_COMMUNITY): Payer: 59

## 2023-02-15 ENCOUNTER — Encounter (HOSPITAL_COMMUNITY): Payer: 59

## 2023-02-18 ENCOUNTER — Ambulatory Visit (HOSPITAL_COMMUNITY)
Admission: RE | Admit: 2023-02-18 | Discharge: 2023-02-18 | Disposition: A | Discharge status: Home / Self Care | Payer: 59 | Source: Ambulatory Visit | Attending: Dermatology | Admitting: Dermatology

## 2023-02-18 ENCOUNTER — Encounter (HOSPITAL_COMMUNITY): Payer: 59

## 2023-02-18 DIAGNOSIS — L732 Hidradenitis suppurativa: Secondary | ICD-10-CM | POA: Diagnosis present

## 2023-02-18 MED ORDER — SODIUM CHLORIDE 0.9 % IV SOLN
10.0000 mg/kg | INTRAVENOUS | Status: DC
  Administered 2023-02-18: 700 mg via INTRAVENOUS
  Filled 2023-02-18: qty 70

## 2023-03-18 ENCOUNTER — Encounter (HOSPITAL_COMMUNITY): Payer: 59

## 2023-03-18 ENCOUNTER — Inpatient Hospital Stay (HOSPITAL_COMMUNITY): Admission: RE | Admit: 2023-03-18 | Payer: 59 | Source: Ambulatory Visit

## 2023-03-23 ENCOUNTER — Ambulatory Visit (HOSPITAL_COMMUNITY)
Admission: RE | Admit: 2023-03-23 | Discharge: 2023-03-23 | Disposition: A | Discharge status: Home / Self Care | Payer: Managed Care, Other (non HMO) | Source: Ambulatory Visit | Attending: Dermatology | Admitting: Dermatology

## 2023-03-23 DIAGNOSIS — L732 Hidradenitis suppurativa: Secondary | ICD-10-CM | POA: Diagnosis present

## 2023-03-23 MED ORDER — SODIUM CHLORIDE 0.9 % IV SOLN
10.0000 mg/kg | INTRAVENOUS | Status: DC
  Administered 2023-03-23: 700 mg via INTRAVENOUS
  Filled 2023-03-23: qty 70

## 2023-04-15 ENCOUNTER — Encounter (HOSPITAL_COMMUNITY): Payer: 59

## 2023-04-20 ENCOUNTER — Ambulatory Visit (HOSPITAL_COMMUNITY)
Admission: RE | Admit: 2023-04-20 | Discharge: 2023-04-20 | Disposition: A | Discharge status: Home / Self Care | Payer: Managed Care, Other (non HMO) | Source: Ambulatory Visit | Attending: Dermatology | Admitting: Dermatology

## 2023-04-20 DIAGNOSIS — L732 Hidradenitis suppurativa: Secondary | ICD-10-CM | POA: Diagnosis present

## 2023-04-20 MED ORDER — SODIUM CHLORIDE 0.9 % IV SOLN
10.0000 mg/kg | INTRAVENOUS | Status: DC
  Administered 2023-04-20: 700 mg via INTRAVENOUS
  Filled 2023-04-20: qty 70

## 2023-05-18 ENCOUNTER — Ambulatory Visit (HOSPITAL_COMMUNITY)
Admission: RE | Admit: 2023-05-18 | Discharge: 2023-05-18 | Disposition: A | Discharge status: Home / Self Care | Payer: 59 | Source: Ambulatory Visit | Attending: Dermatology | Admitting: Dermatology

## 2023-05-18 DIAGNOSIS — L732 Hidradenitis suppurativa: Secondary | ICD-10-CM | POA: Insufficient documentation

## 2023-05-18 DIAGNOSIS — Z79620 Long term (current) use of immunosuppressive biologic: Secondary | ICD-10-CM | POA: Insufficient documentation

## 2023-05-18 MED ORDER — SODIUM CHLORIDE 0.9 % IV SOLN
10.0000 mg/kg | INTRAVENOUS | Status: DC
  Administered 2023-05-18: 700 mg via INTRAVENOUS
  Filled 2023-05-18: qty 70

## 2023-06-15 ENCOUNTER — Ambulatory Visit (HOSPITAL_COMMUNITY)
Admission: RE | Admit: 2023-06-15 | Discharge: 2023-06-15 | Disposition: A | Discharge status: Home / Self Care | Source: Ambulatory Visit | Attending: Dermatology | Admitting: Dermatology

## 2023-06-15 DIAGNOSIS — L732 Hidradenitis suppurativa: Secondary | ICD-10-CM | POA: Diagnosis present

## 2023-06-15 MED ORDER — SODIUM CHLORIDE 0.9 % IV SOLN
10.0000 mg/kg | INTRAVENOUS | Status: DC
  Administered 2023-06-15: 700 mg via INTRAVENOUS
  Filled 2023-06-15: qty 70

## 2023-07-13 ENCOUNTER — Inpatient Hospital Stay (HOSPITAL_COMMUNITY): Admission: RE | Admit: 2023-07-13 | Source: Ambulatory Visit

## 2023-07-26 ENCOUNTER — Ambulatory Visit (HOSPITAL_COMMUNITY)
Admission: RE | Admit: 2023-07-26 | Discharge: 2023-07-26 | Disposition: A | Discharge status: Home / Self Care | Source: Ambulatory Visit | Attending: Dermatology | Admitting: Dermatology

## 2023-07-26 DIAGNOSIS — L732 Hidradenitis suppurativa: Secondary | ICD-10-CM | POA: Insufficient documentation

## 2023-07-26 MED ORDER — SODIUM CHLORIDE 0.9 % IV SOLN
10.0000 mg/kg | INTRAVENOUS | Status: DC
  Administered 2023-07-26: 700 mg via INTRAVENOUS
  Filled 2023-07-26: qty 70

## 2023-08-10 ENCOUNTER — Encounter (HOSPITAL_COMMUNITY)

## 2023-08-22 ENCOUNTER — Other Ambulatory Visit (HOSPITAL_COMMUNITY): Payer: Self-pay | Admitting: *Deleted

## 2023-08-22 DIAGNOSIS — E222 Syndrome of inappropriate secretion of antidiuretic hormone: Secondary | ICD-10-CM

## 2023-08-23 ENCOUNTER — Ambulatory Visit (HOSPITAL_COMMUNITY)
Admission: RE | Admit: 2023-08-23 | Discharge: 2023-08-23 | Disposition: A | Discharge status: Home / Self Care | Source: Ambulatory Visit | Attending: Dermatology | Admitting: Dermatology

## 2023-08-23 DIAGNOSIS — L732 Hidradenitis suppurativa: Secondary | ICD-10-CM | POA: Insufficient documentation

## 2023-08-23 DIAGNOSIS — E222 Syndrome of inappropriate secretion of antidiuretic hormone: Secondary | ICD-10-CM | POA: Insufficient documentation

## 2023-08-23 MED ORDER — SODIUM CHLORIDE 0.9 % IV SOLN
10.0000 mg/kg | INTRAVENOUS | Status: DC
  Administered 2023-08-23: 700 mg via INTRAVENOUS
  Filled 2023-08-23: qty 70

## 2023-08-28 LAB — QUANTIFERON-TB GOLD PLUS (RQFGPL)
QuantiFERON Mitogen Value: >10 [IU]/mL
QuantiFERON Nil Value: 0.08 [IU]/mL
QuantiFERON TB1 Ag Value: 0.11 [IU]/mL
QuantiFERON TB2 Ag Value: 0.07 [IU]/mL

## 2023-08-28 LAB — QUANTIFERON-TB GOLD PLUS: QuantiFERON-TB Gold Plus: NEGATIVE

## 2023-09-15 ENCOUNTER — Telehealth (HOSPITAL_COMMUNITY): Payer: Self-pay

## 2023-09-15 NOTE — Telephone Encounter (Addendum)
 Auth Submission: Denied Site of care: Site of care: [facility] Payer: [employer] Medication & CPT/J Code(s) submitted: Remicade  (Infliximab ) J1745 Diagnosis Code:  Route of submission (phone, fax, portal): fax Phone # Fax # [phone] Auth type: Buy/Bill HB Units/visits requested: 700mg  q 4 weeks    Denied due to remicade  being non-preferred. Preferred med is avsola . Dr's Office has been notified of denial.

## 2023-09-20 ENCOUNTER — Ambulatory Visit (HOSPITAL_COMMUNITY)
Admission: RE | Admit: 2023-09-20 | Discharge: 2023-09-20 | Disposition: A | Discharge status: Home / Self Care | Source: Ambulatory Visit | Attending: Dermatology | Admitting: Dermatology

## 2023-09-20 DIAGNOSIS — L732 Hidradenitis suppurativa: Secondary | ICD-10-CM | POA: Diagnosis present

## 2023-09-20 DIAGNOSIS — Z79620 Long term (current) use of immunosuppressive biologic: Secondary | ICD-10-CM | POA: Diagnosis not present

## 2023-09-20 MED ORDER — SODIUM CHLORIDE 0.9 % IV SOLN
10.0000 mg/kg | INTRAVENOUS | Status: DC
  Administered 2023-09-20: 700 mg via INTRAVENOUS
  Filled 2023-09-20: qty 70

## 2023-09-27 ENCOUNTER — Telehealth: Payer: Self-pay | Admitting: Pharmacy Technician

## 2023-09-27 NOTE — Telephone Encounter (Addendum)
 Auth Submission: DENIED due to [facility] and non preferred med. Tx plan will be d/c.  Site of care: [facility] Payer: [employer] Medication & CPT/J Code(s) submitted: Remicade  (Infliximab ) J1745 Diagnosis Code:  Route of submission (phone, fax, portal): PORTAL Phone # Fax # Auth type: Buy/Bill HB Units/visits requested:  Reference number: [id number] Approval from:  to    Denied due to Remicade  is non preferred. Preferred med is Avsola . Denied: SOC- patient will have to be seen at [facility]. I have spoken with Tracy/RN Phone: [phone]  ext: [phone] Left detailed v/m with MD office    I have called and left v/m with Dr. Lauraine Geralds, awaiting a call back. Denial letter has been sent to MD office along with request to change to Avsola . Phone: [phone] Fax: [phone]  Avsola  auth was submitted via fax 10/04/23 Awaiting response from [employer]ne. Also still awaiting response from MD office.

## 2023-10-18 ENCOUNTER — Inpatient Hospital Stay (HOSPITAL_COMMUNITY): Admission: RE | Admit: 2023-10-18 | Source: Ambulatory Visit

## 2023-10-25 ENCOUNTER — Inpatient Hospital Stay (HOSPITAL_COMMUNITY): Admission: RE | Admit: 2023-10-25 | Source: Ambulatory Visit

## 2023-11-15 ENCOUNTER — Encounter (HOSPITAL_COMMUNITY)

## 2024-02-14 ENCOUNTER — Other Ambulatory Visit (HOSPITAL_COMMUNITY): Payer: Self-pay | Admitting: Pharmacy Technician
